# Patient Record
Sex: Female | Born: 1950 | Race: White | Hispanic: No | Marital: Married | Smoking: Never smoker
Health system: Southern US, Community
[De-identification: ages and names within clinical notes are randomized; demographics above are authoritative.]

## PROBLEM LIST (undated history)

## (undated) DIAGNOSIS — E785 Hyperlipidemia, unspecified: Secondary | ICD-10-CM

## (undated) DIAGNOSIS — E7439 Other disorders of intestinal carbohydrate absorption: Secondary | ICD-10-CM

## (undated) DIAGNOSIS — R296 Repeated falls: Secondary | ICD-10-CM

## (undated) DIAGNOSIS — E049 Nontoxic goiter, unspecified: Secondary | ICD-10-CM

## (undated) DIAGNOSIS — I878 Other specified disorders of veins: Secondary | ICD-10-CM

## (undated) DIAGNOSIS — R262 Difficulty in walking, not elsewhere classified: Secondary | ICD-10-CM

## (undated) DIAGNOSIS — F39 Unspecified mood [affective] disorder: Secondary | ICD-10-CM

## (undated) HISTORY — DX: Other specified disorders of veins: I87.8

## (undated) HISTORY — DX: Other disorders of intestinal carbohydrate absorption: E74.39

## (undated) HISTORY — DX: Hyperlipidemia, unspecified: E78.5

## (undated) HISTORY — PX: AUGMENTATION MAMMAPLASTY: SUR837

---

## 2000-04-28 ENCOUNTER — Other Ambulatory Visit: Admission: RE | Admit: 2000-04-28 | Discharge: 2000-04-28 | Payer: Self-pay | Admitting: Obstetrics and Gynecology

## 2000-07-01 ENCOUNTER — Ambulatory Visit (HOSPITAL_COMMUNITY): Admission: RE | Admit: 2000-07-01 | Discharge: 2000-07-01 | Payer: Self-pay | Admitting: Obstetrics and Gynecology

## 2000-08-24 ENCOUNTER — Encounter (INDEPENDENT_AMBULATORY_CARE_PROVIDER_SITE_OTHER): Payer: Self-pay

## 2000-08-24 ENCOUNTER — Ambulatory Visit (HOSPITAL_COMMUNITY): Admission: RE | Admit: 2000-08-24 | Discharge: 2000-08-24 | Payer: Self-pay | Admitting: Obstetrics and Gynecology

## 2000-10-25 ENCOUNTER — Encounter: Payer: Self-pay | Admitting: Obstetrics and Gynecology

## 2000-10-25 ENCOUNTER — Encounter: Admission: RE | Admit: 2000-10-25 | Discharge: 2000-10-25 | Payer: Self-pay | Admitting: Obstetrics and Gynecology

## 2001-09-11 ENCOUNTER — Emergency Department (HOSPITAL_COMMUNITY): Admission: EM | Admit: 2001-09-11 | Discharge: 2001-09-11 | Payer: Self-pay | Admitting: Emergency Medicine

## 2001-09-14 ENCOUNTER — Encounter (HOSPITAL_COMMUNITY): Admission: RE | Admit: 2001-09-14 | Discharge: 2001-12-13 | Payer: Self-pay | Admitting: Emergency Medicine

## 2001-10-31 ENCOUNTER — Encounter: Admission: RE | Admit: 2001-10-31 | Discharge: 2001-10-31 | Payer: Self-pay | Admitting: Obstetrics and Gynecology

## 2001-10-31 ENCOUNTER — Encounter: Payer: Self-pay | Admitting: Obstetrics and Gynecology

## 2002-11-05 ENCOUNTER — Encounter: Payer: Self-pay | Admitting: Obstetrics and Gynecology

## 2002-11-05 ENCOUNTER — Encounter: Admission: RE | Admit: 2002-11-05 | Discharge: 2002-11-05 | Payer: Self-pay | Admitting: Obstetrics and Gynecology

## 2003-11-22 ENCOUNTER — Encounter: Admission: RE | Admit: 2003-11-22 | Discharge: 2003-11-22 | Payer: Self-pay | Admitting: Obstetrics and Gynecology

## 2004-12-01 ENCOUNTER — Encounter: Admission: RE | Admit: 2004-12-01 | Discharge: 2004-12-01 | Payer: Self-pay | Admitting: Obstetrics and Gynecology

## 2005-07-06 ENCOUNTER — Encounter: Admission: RE | Admit: 2005-07-06 | Discharge: 2005-10-04 | Payer: Self-pay | Admitting: Orthotics

## 2005-12-03 ENCOUNTER — Encounter: Admission: RE | Admit: 2005-12-03 | Discharge: 2005-12-03 | Payer: Self-pay | Admitting: Obstetrics and Gynecology

## 2006-12-05 ENCOUNTER — Encounter: Admission: RE | Admit: 2006-12-05 | Discharge: 2006-12-05 | Payer: Self-pay | Admitting: Obstetrics and Gynecology

## 2007-12-11 ENCOUNTER — Encounter: Admission: RE | Admit: 2007-12-11 | Discharge: 2007-12-11 | Payer: Self-pay | Admitting: Obstetrics and Gynecology

## 2008-12-11 ENCOUNTER — Encounter: Admission: RE | Admit: 2008-12-11 | Discharge: 2008-12-11 | Payer: Self-pay | Admitting: Internal Medicine

## 2009-12-12 ENCOUNTER — Encounter: Admission: RE | Admit: 2009-12-12 | Discharge: 2009-12-12 | Payer: Self-pay | Admitting: Internal Medicine

## 2010-12-22 ENCOUNTER — Encounter
Admission: RE | Admit: 2010-12-22 | Discharge: 2010-12-22 | Payer: Self-pay | Source: Home / Self Care | Attending: Internal Medicine | Admitting: Internal Medicine

## 2011-05-14 NOTE — Op Note (Signed)
[facility]  Patient:    Barbara Eaton, Barbara Eaton                    MRN: [id number] Proc. Date: 08/24/00 Adm. Date:  [id number] Attending:  Maxie Better                           Operative Report  PREOPERATIVE DIAGNOSES:       1. Menometrorrhagia.                               2. Question endometrial polyp.  POSTOPERATIVE DIAGNOSES:      1. Menometrorrhagia                               2. Endometrial polyp.  OPERATION:                    1. Diagnostic hysteroscopy,                               2. Dilatation and curettage.                               3. Cervical polypectomy.                               4. Possible removal of endometrial polyps.  SURGEON:                      Sheronette A. Cherly Hensen, M.D.  ASSISTANT:  ANESTHESIA:                   General anesthesia.  ESTIMATED BLOOD LOSS:         Minimal.  INDICATIONS:                  This is a 60 year old female with menometrorrhagia who was found on sonohistogram to have a thickened posterior uterine wall suggestive of endometrial polyp who now presents for further management. The patient was also known to have a small endocervical polyp. The risks and benefits of the procedure had been explained to the patient. consent was signed and the patient was transferred to the operating room.  DESCRIPTION OF PROCEDURE:     Under adequate general anesthesia, the patient was placed in the dorsal lithotomy position.  Examination under anesthesia revealed an irregular retroflexed uterus.  No adnexal masses could be appreciated.  The patient was sterilely prepped and draped in the usual fashion.  The bladder was catheterized for a moderate amount of urine.  A bivalve speculum was placed in the vagina. Single-tooth tenaculum was placed on the anterior lip of the cervix.  Using polyps forceps, endocervical polyp was removed.  The cervix was then serially dilated up to #[facility] dilator.  A Sorbitol  primed diagnostic hysteroscope was introduced into the uterine cavity and a panoramic inspection revealed posterior wall that appeared thickened suggestive of possible polyps, anterior wall without any polypoid lesions and tubal ostia could be seen bilaterally.  Endocervical canal was inspected.  No other polyps noted.  The hysteroscope was removed.  The cavity was then curetted.  The hysteroscope was  then reinserted.  A small area still remained and that was recuretted as well.  The procedure then was terminated by removing all instruments from the vagina.  Specimen labeled endocervical polyp was sent to pathology as was the endometrial curetting with possible endometrial polyp.  The estimated blood loss was minimal.  There were no complications.  The patient tolerated the procedure well and was transferred to the recovery room in stable condition. DD:  08/24/00 TD:  08/25/00 Job: [id number]

## 2011-12-03 ENCOUNTER — Other Ambulatory Visit: Payer: Self-pay | Admitting: Internal Medicine

## 2011-12-03 DIAGNOSIS — Z1231 Encounter for screening mammogram for malignant neoplasm of breast: Secondary | ICD-10-CM

## 2011-12-07 ENCOUNTER — Other Ambulatory Visit (HOSPITAL_COMMUNITY): Payer: Self-pay | Admitting: Internal Medicine

## 2011-12-07 DIAGNOSIS — Z1231 Encounter for screening mammogram for malignant neoplasm of breast: Secondary | ICD-10-CM

## 2012-01-03 ENCOUNTER — Ambulatory Visit: Payer: Self-pay

## 2012-01-13 ENCOUNTER — Ambulatory Visit (HOSPITAL_COMMUNITY)
Admission: RE | Admit: 2012-01-13 | Discharge: 2012-01-13 | Disposition: A | Discharge status: Home / Self Care | Payer: BC Managed Care – HMO | Source: Ambulatory Visit | Attending: Internal Medicine | Admitting: Internal Medicine

## 2012-01-13 DIAGNOSIS — Z1231 Encounter for screening mammogram for malignant neoplasm of breast: Secondary | ICD-10-CM | POA: Insufficient documentation

## 2012-11-16 ENCOUNTER — Other Ambulatory Visit: Payer: Self-pay | Admitting: General Practice

## 2012-11-16 ENCOUNTER — Other Ambulatory Visit: Payer: Self-pay | Admitting: Plastic Surgery

## 2012-11-16 DIAGNOSIS — Z01818 Encounter for other preprocedural examination: Secondary | ICD-10-CM

## 2012-11-20 ENCOUNTER — Ambulatory Visit
Admission: RE | Admit: 2012-11-20 | Discharge: 2012-11-20 | Disposition: A | Discharge status: Home / Self Care | Payer: Self-pay | Source: Ambulatory Visit | Attending: Plastic Surgery | Admitting: Plastic Surgery

## 2012-11-20 DIAGNOSIS — Z01818 Encounter for other preprocedural examination: Secondary | ICD-10-CM

## 2013-11-14 ENCOUNTER — Other Ambulatory Visit (HOSPITAL_COMMUNITY): Payer: Self-pay | Admitting: Internal Medicine

## 2013-11-14 DIAGNOSIS — Z1231 Encounter for screening mammogram for malignant neoplasm of breast: Secondary | ICD-10-CM

## 2013-12-18 ENCOUNTER — Ambulatory Visit (HOSPITAL_COMMUNITY)
Admission: RE | Admit: 2013-12-18 | Discharge: 2013-12-18 | Disposition: A | Discharge status: Home / Self Care | Payer: BC Managed Care – PPO | Source: Ambulatory Visit | Attending: Internal Medicine | Admitting: Internal Medicine

## 2013-12-18 DIAGNOSIS — Z1231 Encounter for screening mammogram for malignant neoplasm of breast: Secondary | ICD-10-CM | POA: Insufficient documentation

## 2013-12-31 ENCOUNTER — Other Ambulatory Visit: Payer: Self-pay | Admitting: Internal Medicine

## 2013-12-31 DIAGNOSIS — R928 Other abnormal and inconclusive findings on diagnostic imaging of breast: Secondary | ICD-10-CM

## 2014-01-03 ENCOUNTER — Other Ambulatory Visit: Payer: MEDICARE

## 2014-01-03 ENCOUNTER — Ambulatory Visit
Admission: RE | Admit: 2014-01-03 | Discharge: 2014-01-03 | Disposition: A | Discharge status: Home / Self Care | Payer: Self-pay | Source: Ambulatory Visit | Attending: Internal Medicine | Admitting: Internal Medicine

## 2014-01-03 ENCOUNTER — Other Ambulatory Visit: Payer: Self-pay | Admitting: Internal Medicine

## 2014-01-03 DIAGNOSIS — R928 Other abnormal and inconclusive findings on diagnostic imaging of breast: Secondary | ICD-10-CM

## 2014-01-04 ENCOUNTER — Other Ambulatory Visit: Payer: MEDICARE

## 2014-01-08 ENCOUNTER — Other Ambulatory Visit: Payer: Self-pay | Admitting: Internal Medicine

## 2014-01-08 DIAGNOSIS — R109 Unspecified abdominal pain: Secondary | ICD-10-CM

## 2014-01-14 ENCOUNTER — Ambulatory Visit
Admission: RE | Admit: 2014-01-14 | Discharge: 2014-01-14 | Disposition: A | Discharge status: Home / Self Care | Payer: No Typology Code available for payment source | Source: Ambulatory Visit | Attending: Internal Medicine | Admitting: Internal Medicine

## 2014-01-14 ENCOUNTER — Ambulatory Visit
Admission: RE | Admit: 2014-01-14 | Discharge: 2014-01-14 | Disposition: A | Discharge status: Home / Self Care | Payer: Self-pay | Source: Ambulatory Visit | Attending: Internal Medicine | Admitting: Internal Medicine

## 2014-01-14 DIAGNOSIS — R109 Unspecified abdominal pain: Secondary | ICD-10-CM

## 2014-05-31 ENCOUNTER — Other Ambulatory Visit: Payer: Self-pay | Admitting: Plastic Surgery

## 2014-05-31 ENCOUNTER — Other Ambulatory Visit: Payer: Self-pay

## 2014-05-31 ENCOUNTER — Other Ambulatory Visit: Payer: Self-pay | Admitting: Internal Medicine

## 2014-05-31 DIAGNOSIS — N6320 Unspecified lump in the left breast, unspecified quadrant: Secondary | ICD-10-CM

## 2014-06-03 ENCOUNTER — Other Ambulatory Visit: Payer: Self-pay | Admitting: Internal Medicine

## 2014-06-03 DIAGNOSIS — N6320 Unspecified lump in the left breast, unspecified quadrant: Secondary | ICD-10-CM

## 2014-07-02 ENCOUNTER — Ambulatory Visit
Admission: RE | Admit: 2014-07-02 | Discharge: 2014-07-02 | Disposition: A | Discharge status: Home / Self Care | Payer: No Typology Code available for payment source | Source: Ambulatory Visit | Attending: Internal Medicine | Admitting: Internal Medicine

## 2014-07-02 ENCOUNTER — Encounter (INDEPENDENT_AMBULATORY_CARE_PROVIDER_SITE_OTHER): Payer: Self-pay

## 2014-07-02 DIAGNOSIS — N6320 Unspecified lump in the left breast, unspecified quadrant: Secondary | ICD-10-CM

## 2014-07-04 ENCOUNTER — Other Ambulatory Visit: Payer: Self-pay | Admitting: Internal Medicine

## 2014-07-04 DIAGNOSIS — Z1231 Encounter for screening mammogram for malignant neoplasm of breast: Secondary | ICD-10-CM

## 2014-11-19 IMAGING — US US TRANSVAGINAL NON-OB
1 series · 14 of 25 positions shown · non-contrast
Comparison: None.

CLINICAL DATA: Abdominal distention, constipation.

EXAM:
TRANSABDOMINAL AND TRANSVAGINAL ULTRASOUND OF PELVIS
TECHNIQUE: Both transabdominal and transvaginal ultrasound examinations of the
pelvis were performed. Transabdominal technique was performed for
global imaging of the pelvis including uterus, ovaries, adnexal
regions, and pelvic cul-de-sac. It was necessary to proceed with
endovaginal exam following the transabdominal exam to visualize the
uterus, endometrium, ovaries and adnexal regions.

[Series 1: us transvaginal non-ob · 0.32mm/px · 14 of 32 slices shown]
[im 1/32]
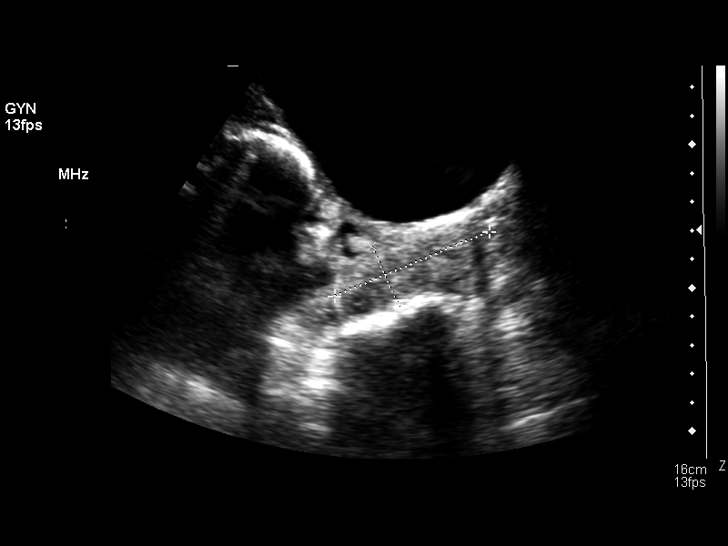
[im 3/32]
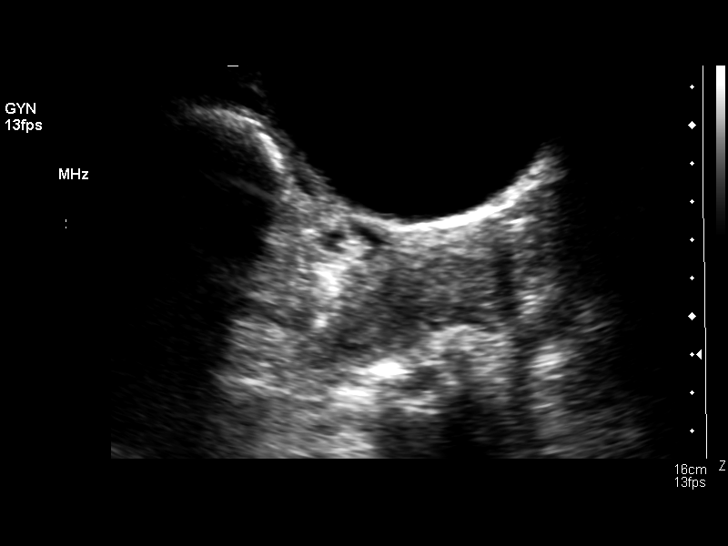
[im 6/32]
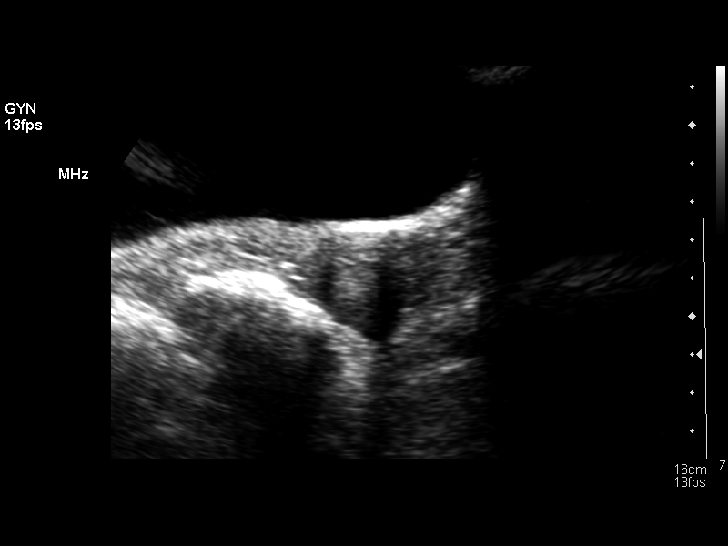
[im 8/32]
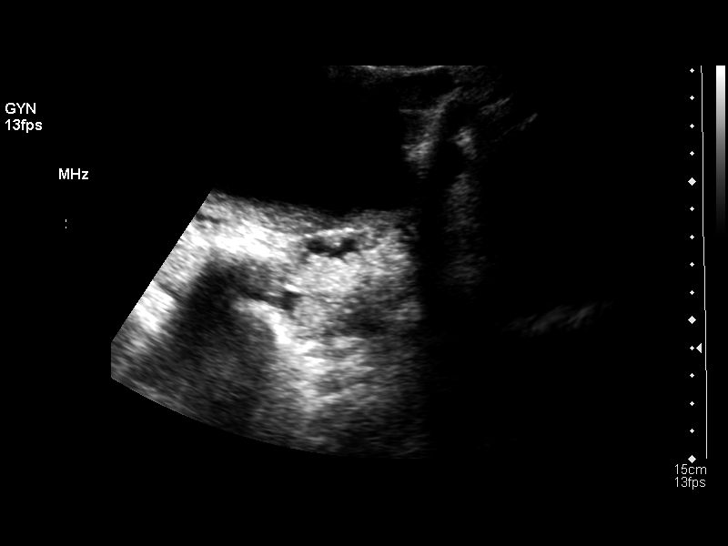
[im 11/32]
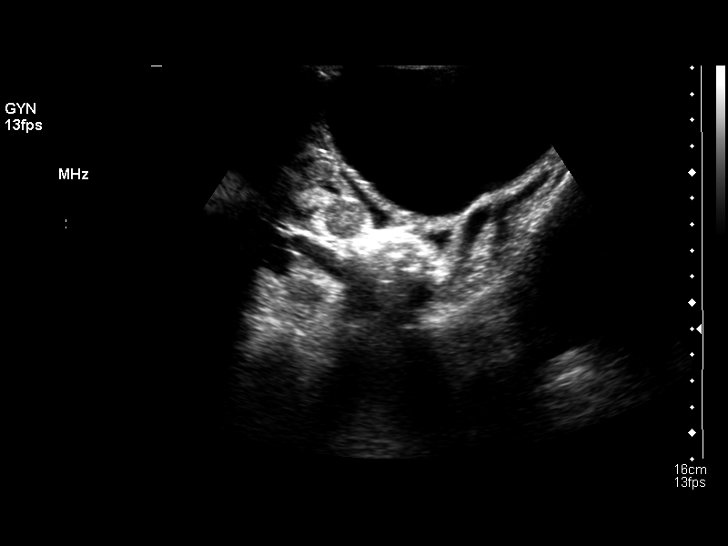
[im 12/32]
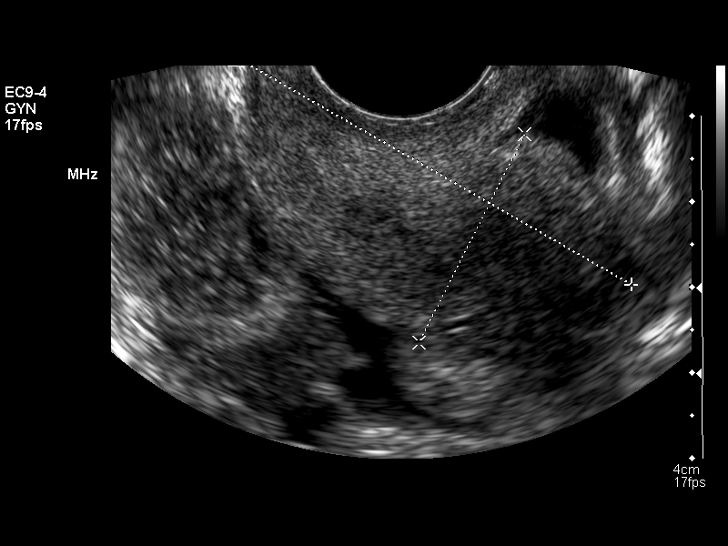
[im 15/32]
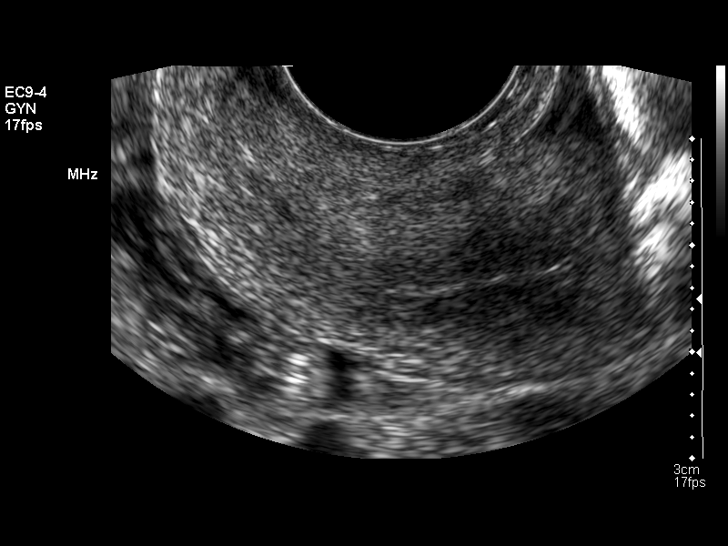
[im 17/32]
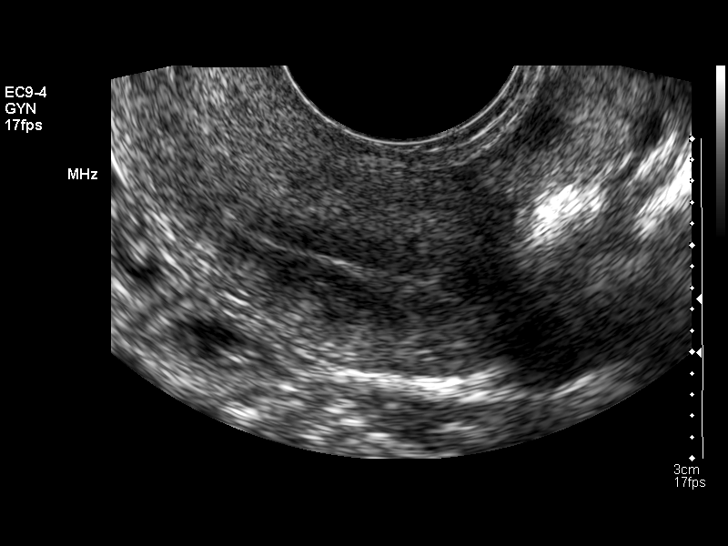
[im 20/32]
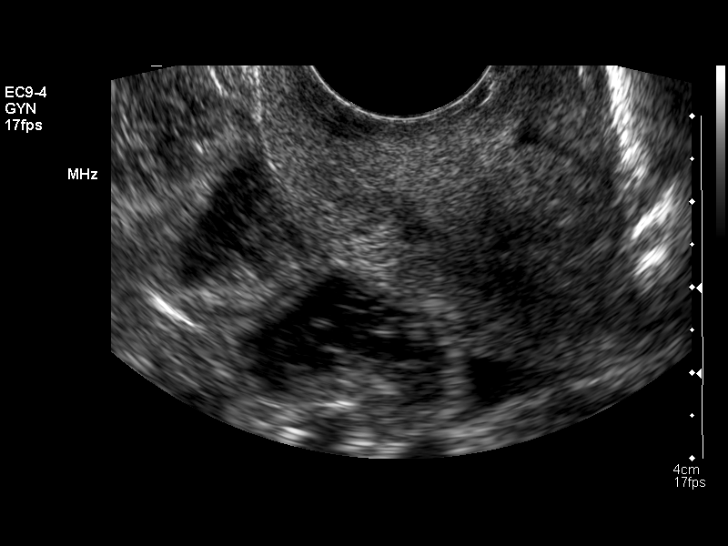
[im 21/32]
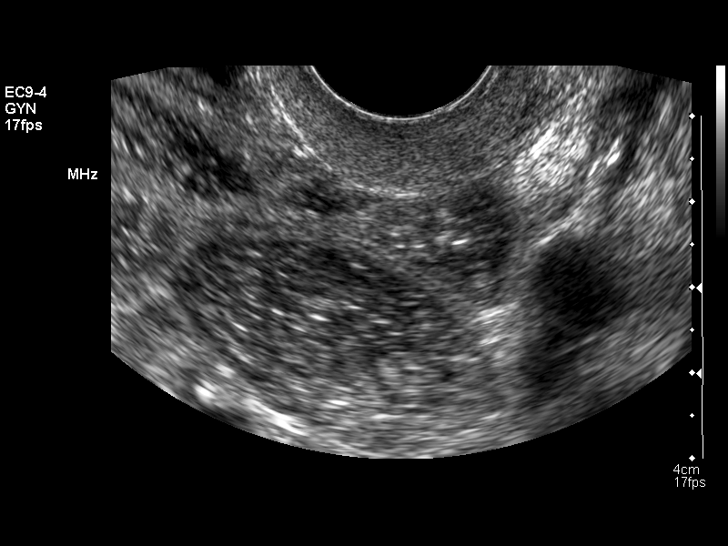
[im 24/32]
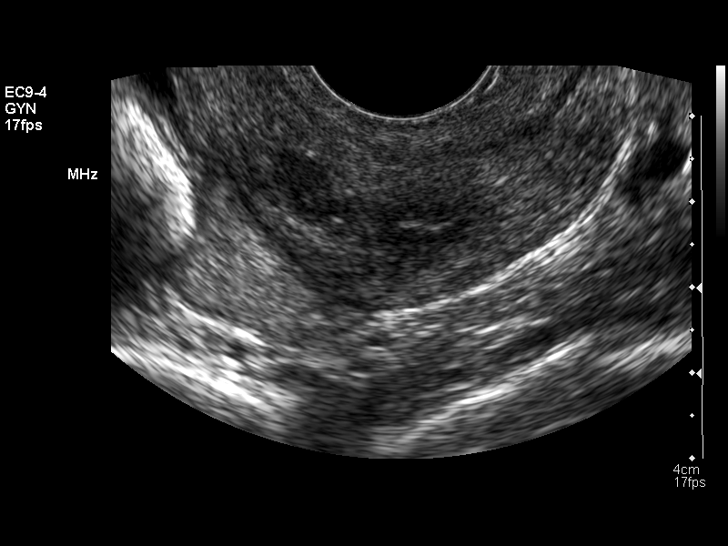
[im 26/32]
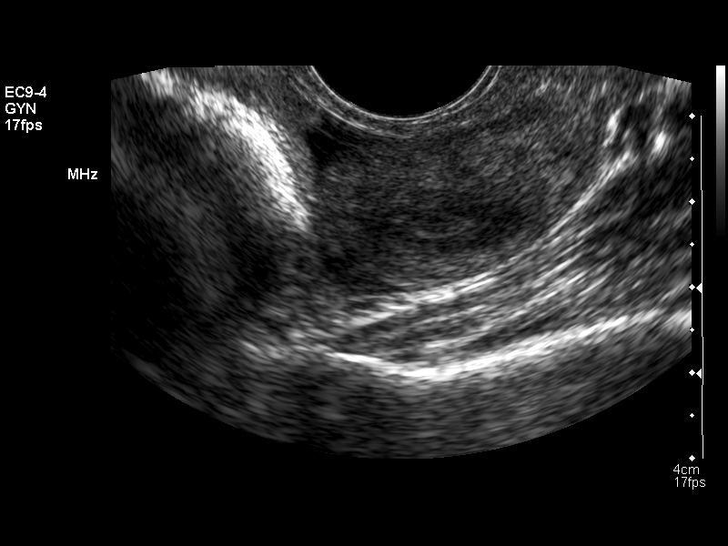
[im 29/32]
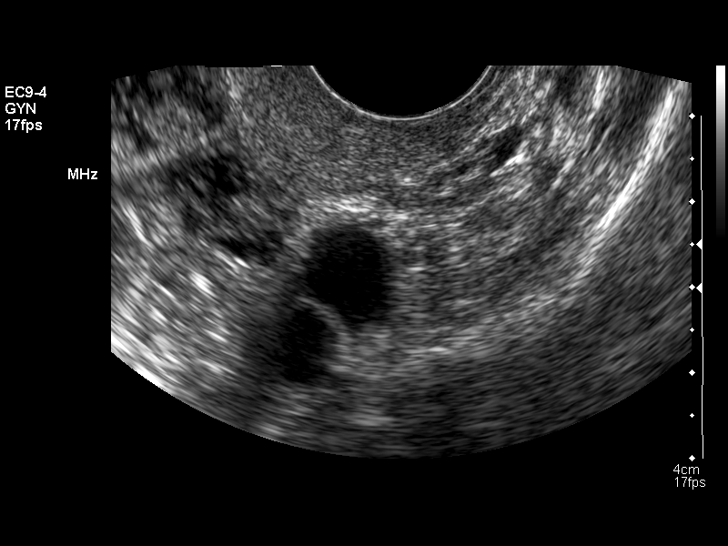
[im 32/32]
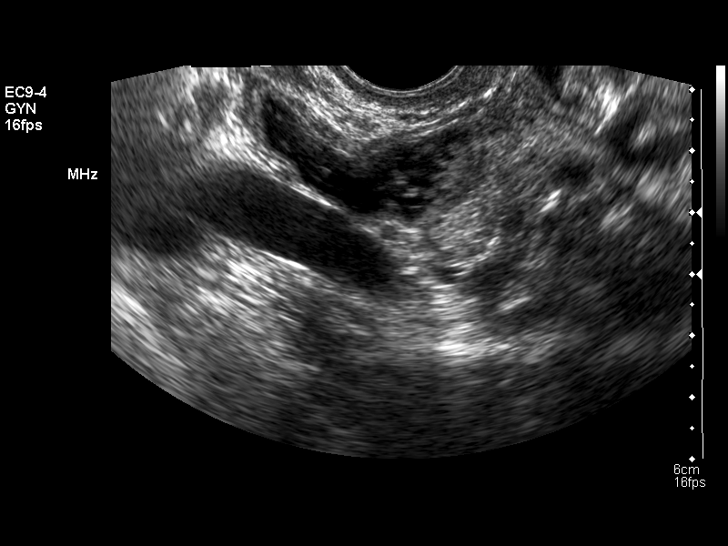

[14 of 25 positions shown; findings below may reference images not displayed]

FINDINGS: Uterus

Measurements: 5.2 x 2.7 x 3.5 cm. An intramural hypoechoic lesion in
the right aspect of the uterine body measures 1.2 x 0.7 x 1.2 cm.
Parenchymal echogenicity is otherwise uniform.

Endometrium

Thickness: 2 mm, within normal limits. No focal abnormality
visualized.

Right ovary

Not visualized.

Left ovary

Not visualized.

Other findings

No free fluid.
IMPRESSION: 1. Small uterine fibroid.
2. Ovaries are not visualized.

## 2014-12-05 ENCOUNTER — Other Ambulatory Visit (HOSPITAL_COMMUNITY): Payer: Self-pay | Admitting: Internal Medicine

## 2014-12-05 DIAGNOSIS — Z1231 Encounter for screening mammogram for malignant neoplasm of breast: Secondary | ICD-10-CM

## 2014-12-31 ENCOUNTER — Ambulatory Visit (HOSPITAL_COMMUNITY)
Admission: RE | Admit: 2014-12-31 | Discharge: 2014-12-31 | Disposition: A | Discharge status: Home / Self Care | Payer: BLUE CROSS/BLUE SHIELD | Source: Ambulatory Visit | Attending: Internal Medicine | Admitting: Internal Medicine

## 2014-12-31 ENCOUNTER — Other Ambulatory Visit (HOSPITAL_COMMUNITY): Payer: Self-pay | Admitting: Internal Medicine

## 2014-12-31 DIAGNOSIS — Z1231 Encounter for screening mammogram for malignant neoplasm of breast: Secondary | ICD-10-CM

## 2019-08-30 ENCOUNTER — Other Ambulatory Visit: Payer: Self-pay | Admitting: *Deleted

## 2019-08-30 ENCOUNTER — Other Ambulatory Visit: Payer: Self-pay | Admitting: Physician Assistant

## 2019-08-30 ENCOUNTER — Other Ambulatory Visit: Payer: Self-pay | Admitting: Obstetrics & Gynecology

## 2019-08-30 DIAGNOSIS — N6001 Solitary cyst of right breast: Secondary | ICD-10-CM

## 2019-09-14 ENCOUNTER — Other Ambulatory Visit: Payer: Self-pay | Admitting: Certified Nurse Midwife

## 2019-09-18 ENCOUNTER — Other Ambulatory Visit: Payer: Self-pay

## 2019-09-18 ENCOUNTER — Other Ambulatory Visit: Payer: Self-pay | Admitting: Physician Assistant

## 2019-09-18 ENCOUNTER — Ambulatory Visit
Admission: RE | Admit: 2019-09-18 | Discharge: 2019-09-18 | Disposition: A | Discharge status: Home / Self Care | Payer: Medicare Other | Source: Ambulatory Visit | Attending: Physician Assistant | Admitting: Physician Assistant

## 2019-09-18 DIAGNOSIS — N6001 Solitary cyst of right breast: Secondary | ICD-10-CM

## 2020-03-11 ENCOUNTER — Ambulatory Visit
Admission: RE | Admit: 2020-03-11 | Discharge: 2020-03-11 | Disposition: A | Discharge status: Home / Self Care | Payer: Medicare Other | Source: Ambulatory Visit | Attending: Physician Assistant | Admitting: Physician Assistant

## 2020-03-11 ENCOUNTER — Other Ambulatory Visit: Payer: Self-pay

## 2020-03-11 DIAGNOSIS — N6001 Solitary cyst of right breast: Secondary | ICD-10-CM

## 2020-03-13 ENCOUNTER — Other Ambulatory Visit: Payer: Self-pay | Admitting: Obstetrics & Gynecology

## 2020-03-13 DIAGNOSIS — N6310 Unspecified lump in the right breast, unspecified quadrant: Secondary | ICD-10-CM

## 2021-03-12 ENCOUNTER — Ambulatory Visit
Admission: RE | Admit: 2021-03-12 | Discharge: 2021-03-12 | Disposition: A | Discharge status: Home / Self Care | Payer: Medicare Other | Source: Ambulatory Visit | Attending: Obstetrics & Gynecology | Admitting: Obstetrics & Gynecology

## 2021-03-12 ENCOUNTER — Other Ambulatory Visit: Payer: Self-pay

## 2021-03-12 DIAGNOSIS — N6310 Unspecified lump in the right breast, unspecified quadrant: Secondary | ICD-10-CM

## 2022-02-08 ENCOUNTER — Other Ambulatory Visit: Payer: Self-pay | Admitting: Obstetrics & Gynecology

## 2022-02-08 DIAGNOSIS — Z1231 Encounter for screening mammogram for malignant neoplasm of breast: Secondary | ICD-10-CM

## 2022-03-15 ENCOUNTER — Other Ambulatory Visit: Payer: Self-pay

## 2022-03-15 ENCOUNTER — Ambulatory Visit
Admission: RE | Admit: 2022-03-15 | Discharge: 2022-03-15 | Disposition: A | Discharge status: Home / Self Care | Payer: Medicare Other | Source: Ambulatory Visit | Attending: Obstetrics & Gynecology | Admitting: Obstetrics & Gynecology

## 2022-03-15 DIAGNOSIS — Z1231 Encounter for screening mammogram for malignant neoplasm of breast: Secondary | ICD-10-CM

## 2022-10-22 ENCOUNTER — Other Ambulatory Visit: Payer: Self-pay | Admitting: Family Medicine

## 2022-10-22 DIAGNOSIS — Z1231 Encounter for screening mammogram for malignant neoplasm of breast: Secondary | ICD-10-CM

## 2023-01-18 IMAGING — MG DIGITAL SCREENING BREAST BILAT IMPLANT W/ TOMO W/ CAD
9 of 14 series · 9 of 34 positions shown · non-contrast
Comparison: Previous exam(s).

CLINICAL DATA: Screening.

EXAM:
DIGITAL SCREENING BILATERAL MAMMOGRAM WITH IMPLANTS, CAD AND
TOMOSYNTHESIS
TECHNIQUE: Bilateral screening digital craniocaudal and mediolateral oblique
mammograms were obtained. Bilateral screening digital breast
tomosynthesis was performed. The images were evaluated with
computer-aided detection. Standard and/or implant displaced views
were performed.

[R CC]
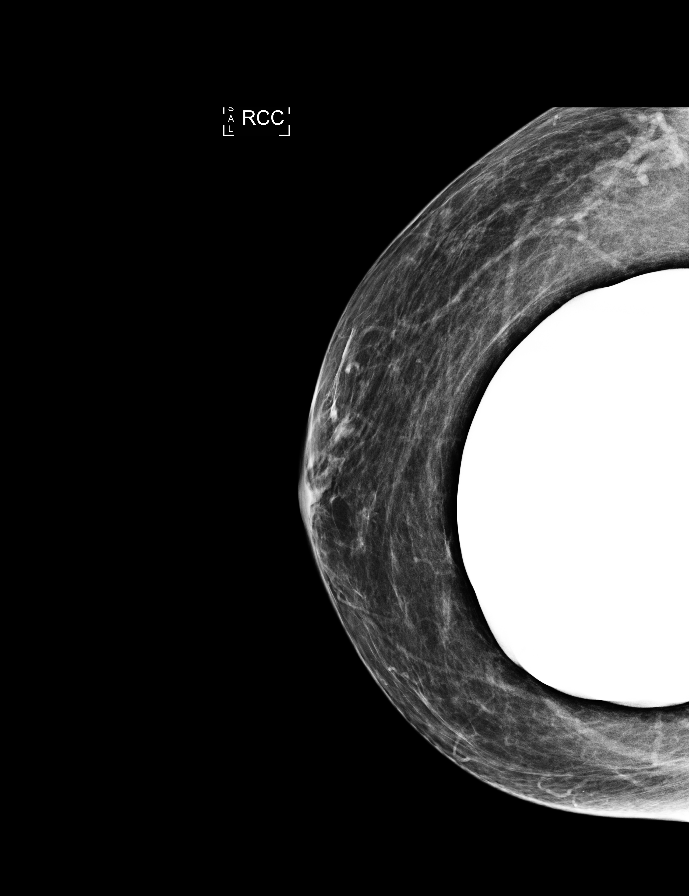

[R MLO]
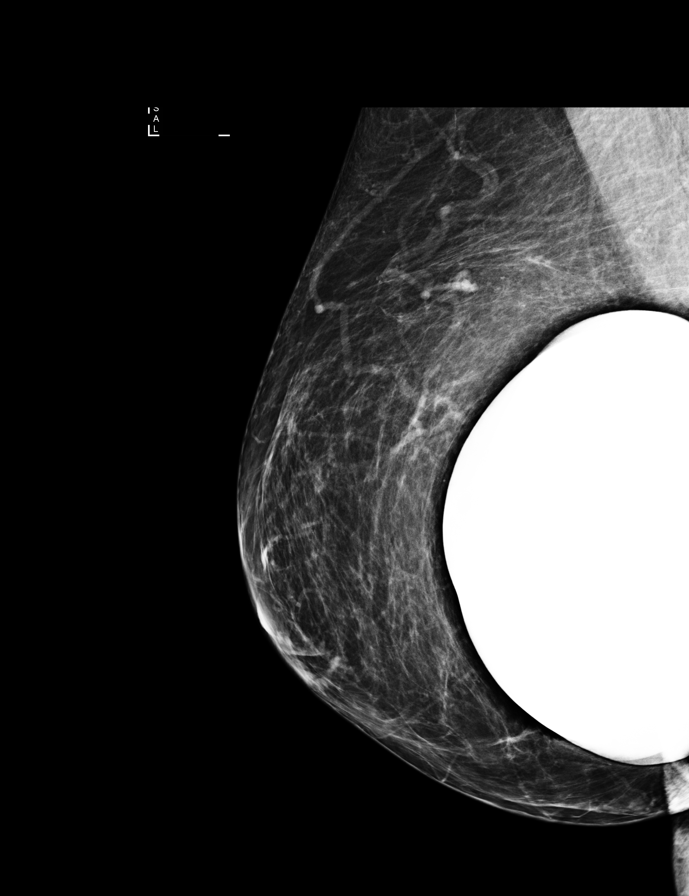

[L CC]
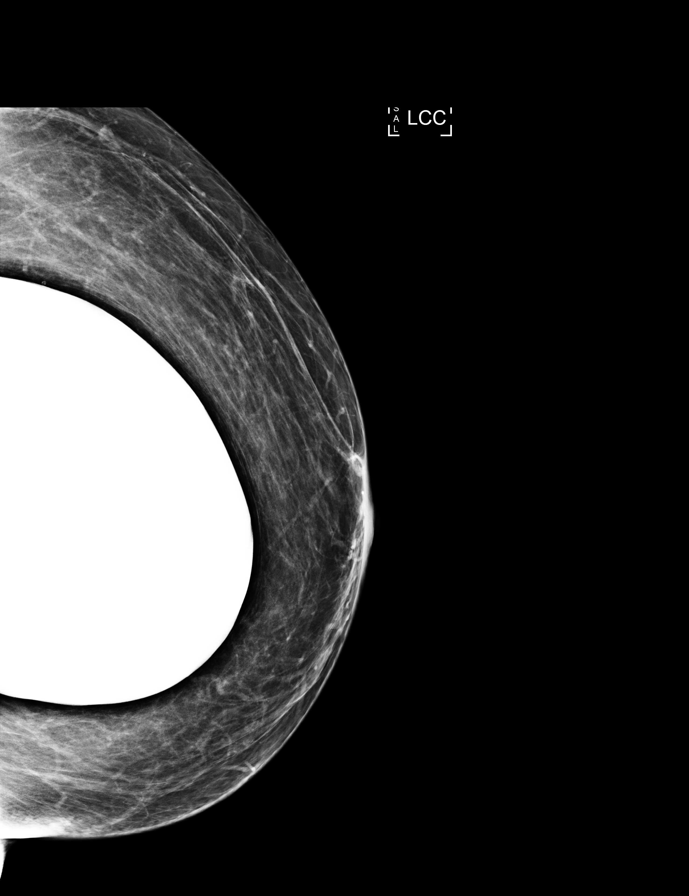

[L MLO]
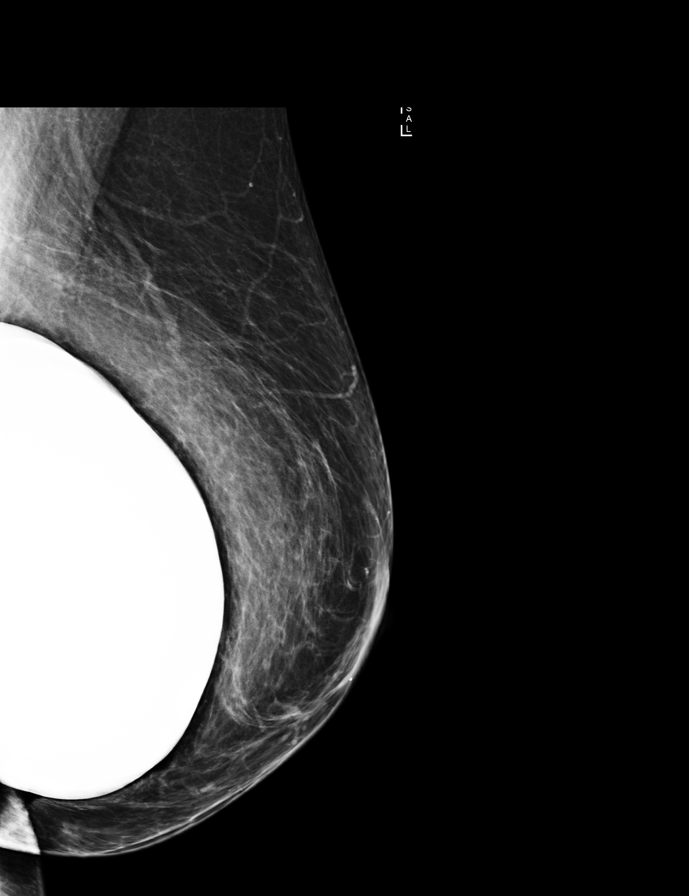

[R MLO synth-2D]
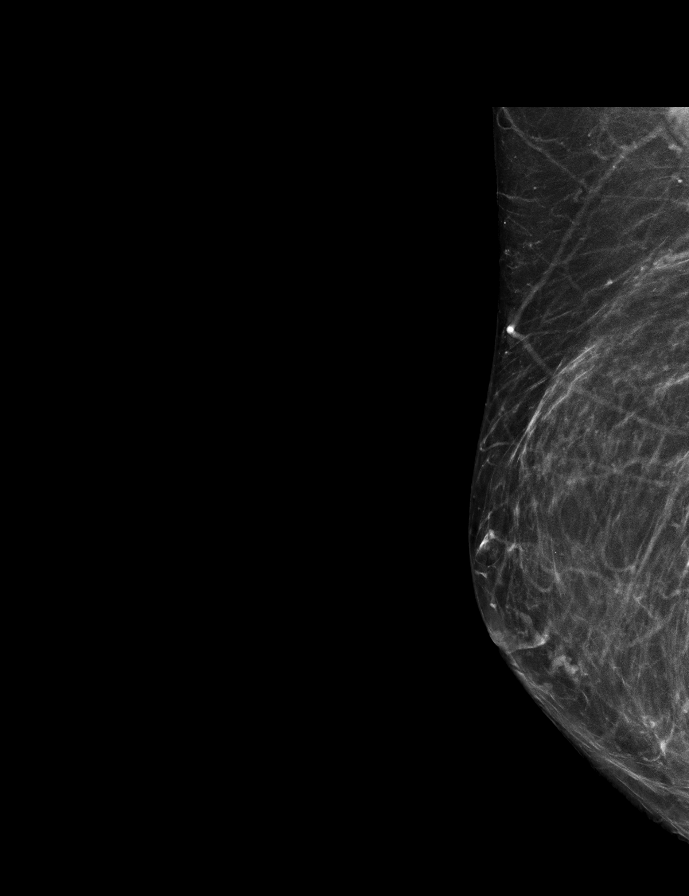

[R CC synth-2D]
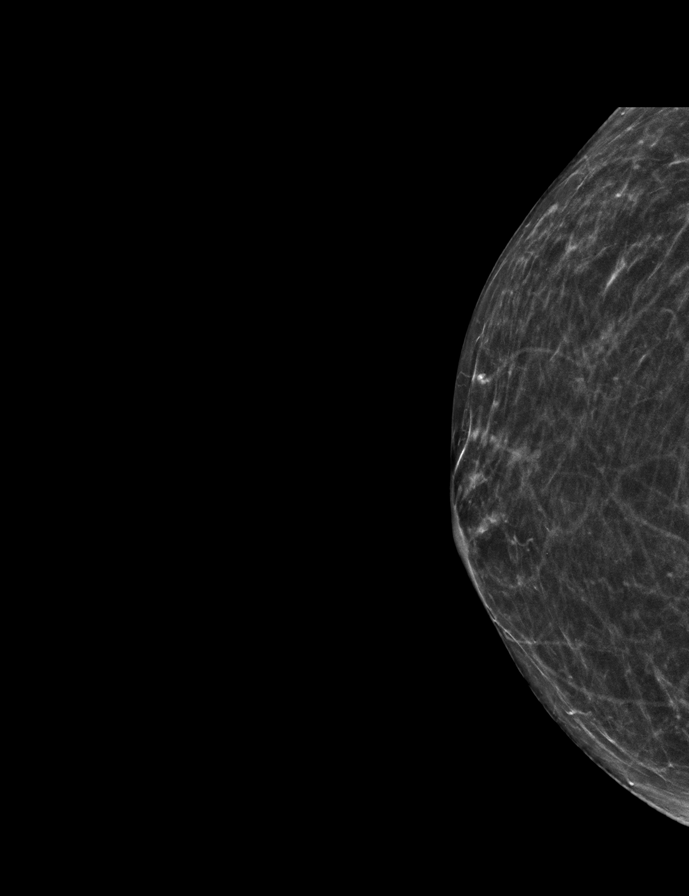

[L MLO synth-2D]
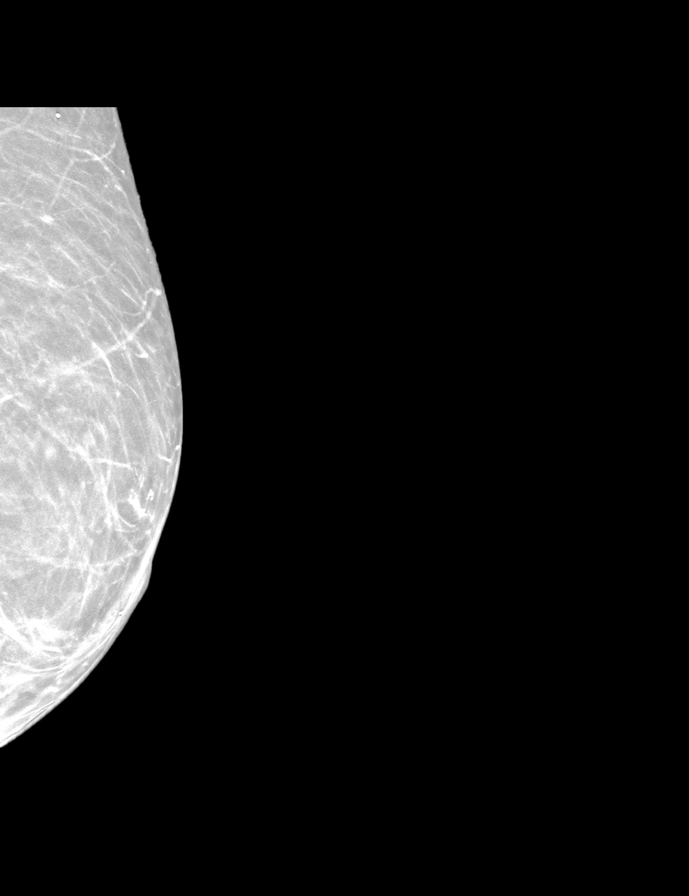

[L CC synth-2D (1 of 2)]
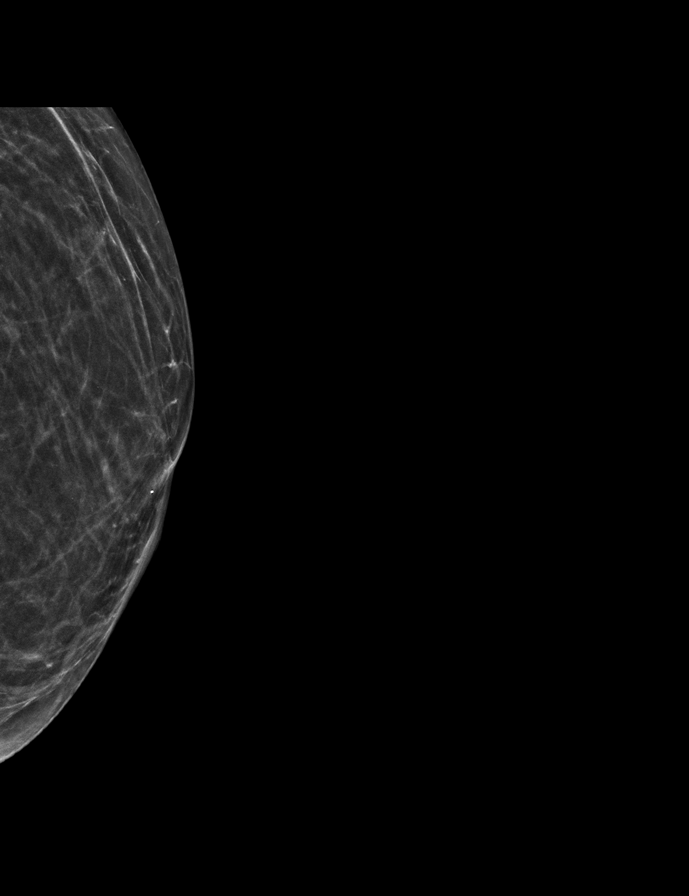

[L CC synth-2D (2 of 2)]
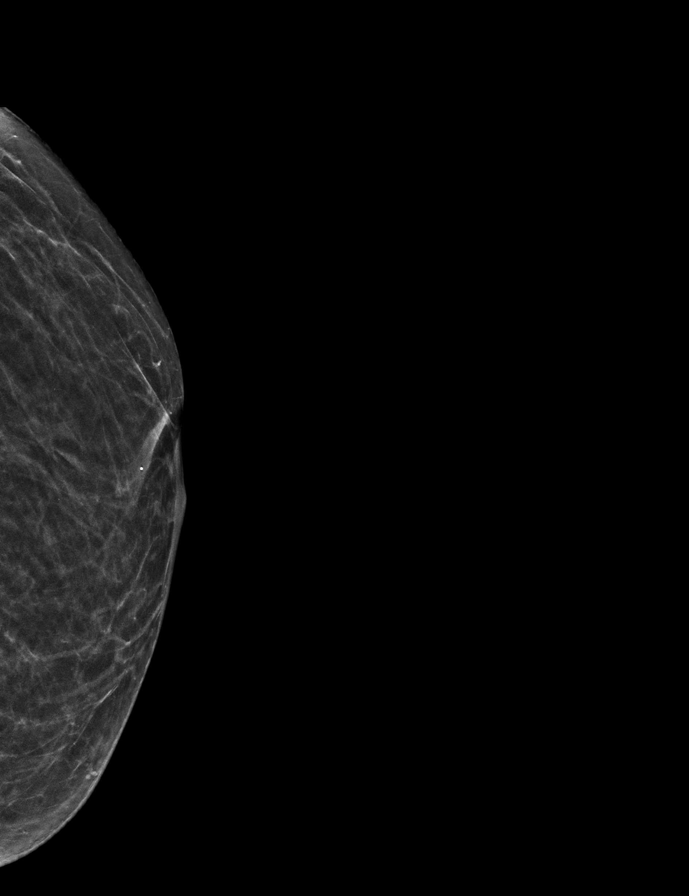

[9 of 34 positions shown; findings below may reference images not displayed]

ACR Breast Density Category b: There are scattered areas of
fibroglandular density.
FINDINGS: The patient has prepectoral implants. There are no findings
suspicious for malignancy.
IMPRESSION: No mammographic evidence of malignancy. A result letter of this
screening mammogram will be mailed directly to the patient.

RECOMMENDATION:
Screening mammogram in one year. (Code:TC-L-OXK)

BI-RADS CATEGORY  1:  Negative.

## 2023-03-18 ENCOUNTER — Ambulatory Visit
Admission: RE | Admit: 2023-03-18 | Discharge: 2023-03-18 | Disposition: A | Discharge status: Home / Self Care | Payer: Medicare Other | Source: Ambulatory Visit | Attending: Family Medicine

## 2023-03-18 DIAGNOSIS — Z1231 Encounter for screening mammogram for malignant neoplasm of breast: Secondary | ICD-10-CM

## 2023-09-10 ENCOUNTER — Other Ambulatory Visit: Payer: Self-pay

## 2023-09-10 ENCOUNTER — Emergency Department (HOSPITAL_COMMUNITY): Payer: Medicare Other

## 2023-09-10 ENCOUNTER — Emergency Department (HOSPITAL_COMMUNITY)
Admission: EM | Admit: 2023-09-10 | Discharge: 2023-09-10 | Disposition: A | Discharge status: Home / Self Care | Payer: Medicare Other | Attending: Emergency Medicine | Admitting: Emergency Medicine

## 2023-09-10 DIAGNOSIS — M25571 Pain in right ankle and joints of right foot: Secondary | ICD-10-CM | POA: Diagnosis not present

## 2023-09-10 DIAGNOSIS — R0602 Shortness of breath: Secondary | ICD-10-CM | POA: Diagnosis not present

## 2023-09-10 DIAGNOSIS — M25572 Pain in left ankle and joints of left foot: Secondary | ICD-10-CM | POA: Insufficient documentation

## 2023-09-10 DIAGNOSIS — R011 Cardiac murmur, unspecified: Secondary | ICD-10-CM | POA: Insufficient documentation

## 2023-09-10 DIAGNOSIS — M25551 Pain in right hip: Secondary | ICD-10-CM | POA: Insufficient documentation

## 2023-09-10 DIAGNOSIS — E876 Hypokalemia: Secondary | ICD-10-CM | POA: Diagnosis not present

## 2023-09-10 DIAGNOSIS — M7989 Other specified soft tissue disorders: Secondary | ICD-10-CM | POA: Diagnosis present

## 2023-09-10 DIAGNOSIS — R600 Localized edema: Secondary | ICD-10-CM | POA: Diagnosis not present

## 2023-09-10 DIAGNOSIS — G8929 Other chronic pain: Secondary | ICD-10-CM | POA: Insufficient documentation

## 2023-09-10 LAB — CBC WITH DIFFERENTIAL/PLATELET
Abs Immature Granulocytes: 0.01 10*3/uL (ref 0.00–0.07)
Basophils Absolute: 0.1 10*3/uL (ref 0.0–0.1)
Basophils Relative: 2 %
Eosinophils Absolute: 0.1 10*3/uL (ref 0.0–0.5)
Eosinophils Relative: 1 %
HCT: 38.3 % (ref 36.0–46.0)
Hemoglobin: 13.5 g/dL (ref 12.0–15.0)
Immature Granulocytes: 0 %
Lymphocytes Relative: 28 %
Lymphs Abs: 1.3 10*3/uL (ref 0.7–4.0)
MCH: 31.1 pg (ref 26.0–34.0)
MCHC: 35.2 g/dL (ref 30.0–36.0)
MCV: 88.2 fL (ref 80.0–100.0)
Monocytes Absolute: 0.5 10*3/uL (ref 0.1–1.0)
Monocytes Relative: 11 %
Neutro Abs: 2.7 10*3/uL (ref 1.7–7.7)
Neutrophils Relative %: 58 %
Platelets: 277 10*3/uL (ref 150–400)
RBC: 4.34 MIL/uL (ref 3.87–5.11)
RDW: 13.1 % (ref 11.5–15.5)
WBC: 4.6 10*3/uL (ref 4.0–10.5)
nRBC: 0 % (ref 0.0–0.2)

## 2023-09-10 LAB — BASIC METABOLIC PANEL
Anion gap: 8 (ref 5–15)
BUN: 12 mg/dL (ref 8–23)
CO2: 25 mmol/L (ref 22–32)
Calcium: 8.7 mg/dL — ABNORMAL LOW (ref 8.9–10.3)
Chloride: 100 mmol/L (ref 98–111)
Creatinine, Ser: 0.58 mg/dL (ref 0.44–1.00)
GFR, Estimated: >60 mL/min (ref >60)
Glucose, Bld: 112 mg/dL — ABNORMAL HIGH (ref 70–99)
Potassium: 3 mmol/L — ABNORMAL LOW (ref 3.5–5.1)
Sodium: 133 mmol/L — ABNORMAL LOW (ref 135–145)

## 2023-09-10 LAB — BRAIN NATRIURETIC PEPTIDE: B Natriuretic Peptide: 24 pg/mL (ref 0.0–100.0)

## 2023-09-10 LAB — TROPONIN I (HIGH SENSITIVITY)
Troponin I (High Sensitivity): 8 ng/L (ref <18)
Troponin I (High Sensitivity): 9 ng/L (ref <18)

## 2023-09-10 MED ORDER — FUROSEMIDE 10 MG/ML IJ SOLN
20.0000 mg | Freq: Once | INTRAMUSCULAR | Status: AC
  Administered 2023-09-10: 20 mg via INTRAVENOUS
  Filled 2023-09-10: qty 2

## 2023-09-10 MED ORDER — TRAMADOL HCL 50 MG PO TABS
50.0000 mg | ORAL_TABLET | Freq: Four times a day (QID) | ORAL | 0 refills | Status: DC | PRN
Start: 2023-09-10 — End: 2023-10-10

## 2023-09-10 MED ORDER — POTASSIUM CHLORIDE CRYS ER 20 MEQ PO TBCR
40.0000 meq | EXTENDED_RELEASE_TABLET | Freq: Once | ORAL | Status: AC
  Administered 2023-09-10: 40 meq via ORAL
  Filled 2023-09-10: qty 2

## 2023-09-10 MED ORDER — FENTANYL CITRATE PF 50 MCG/ML IJ SOSY
50.0000 ug | PREFILLED_SYRINGE | Freq: Once | INTRAMUSCULAR | Status: AC
  Administered 2023-09-10: 50 ug via INTRAVENOUS
  Filled 2023-09-10: qty 1

## 2023-09-10 NOTE — Discharge Instructions (Signed)
You were seen in the emergency department for increased swelling and pain in your feet and ankles due to swelling.  Your lab work showed your potassium was mildly low.  You were given some potassium and an IV dose of diuretic.  Please keep to a low-salt diet and elevate your legs when you can.  Use the compression stockings when you are able to get them.  Follow-up with your primary care doctor for further evaluation.

## 2023-09-10 NOTE — ED Provider Notes (Signed)
[name] Provider Note   CSN: [id number] Arrival date & time: 09/10/23  1149     History  Chief Complaint  Patient presents with   Ankle Pain    Barbara Eaton is a 72 y.o. female.  She is brought in by ambulance from home with complaint of pain and swelling in both of her ankles and feet.  She said this has been going on a month.  She has chronic pain in her right hip and supposed to be getting surgery next month.  She says that causes her chronic pain.  She has been talking to her doctor about her new foot swelling for the last month and finally saw them 2 days ago.  He took her off of amlodipine and put her on Mobic and losartan.  She said that it turned her bowels black so she stopped taking them.  He also prescribed a compression stocking but it was the wrong size and they are ordering her a new one.  Is also complaining of feeling short of breath today.  No cough no fever.  No recent falls.  She said she cannot take narcotics for pain because her orthopedic doctor told her it would affect her healing.  The history is provided by the patient.  Ankle Pain Location:  Ankle and foot Time since incident:  1 month Injury: no   Ankle location:  L ankle and R ankle Foot location:  L foot and R foot Pain details:    Quality:  Aching   Onset quality:  Gradual   Duration:  1 month   Timing:  Constant   Progression:  Worsening Chronicity:  New Relieved by:  Nothing Worsened by:  Bearing weight Ineffective treatments:  Rest Associated symptoms: swelling   Associated symptoms: no fever        Home Medications Prior to Admission medications   Not on File      Allergies    Patient has no allergy information on record.    Review of Systems   Review of Systems  Constitutional:  Negative for fever.  HENT:  Negative for sore throat.   Respiratory:  Positive for shortness of breath.   Cardiovascular:  Positive for leg swelling.  Negative for chest pain.  Gastrointestinal:  Negative for abdominal pain.  Genitourinary:  Negative for dysuria.  Skin:  Negative for rash.    Physical Exam Updated Vital Signs BP (!) 150/73 (BP Location: Left Arm)   Pulse 69   Temp 97.9 F (36.6 C) (Oral)   Resp 17   Ht 5\' 1"  (1.549 m)   Wt 68 kg   SpO2 99%   BMI 28.34 kg/m  Physical Exam Vitals and nursing note reviewed.  Constitutional:      General: She is not in acute distress.    Appearance: Normal appearance. She is well-developed.  HENT:     Head: Normocephalic and atraumatic.  Eyes:     Conjunctiva/sclera: Conjunctivae normal.  Cardiovascular:     Rate and Rhythm: Normal rate and regular rhythm.     Heart sounds: Murmur (SEM) heard.  Pulmonary:     Effort: Pulmonary effort is normal. No respiratory distress.     Breath sounds: Normal breath sounds.  Abdominal:     Palpations: Abdomen is soft.     Tenderness: There is no abdominal tenderness. There is no guarding or rebound.  Musculoskeletal:     Cervical back: Neck supple.  Right lower leg: Edema present.     Left lower leg: Edema present.  Skin:    General: Skin is warm and dry.     Capillary Refill: Capillary refill takes less than 2 seconds.  Neurological:     General: No focal deficit present.     Mental Status: She is alert.     ED Results / Procedures / Treatments   Labs (all labs ordered are listed, but only abnormal results are displayed) Labs Reviewed  BASIC METABOLIC PANEL - Abnormal; Notable for the following components:      Result Value   Sodium 133 (*)    Potassium 3.0 (*)    Glucose, Bld 112 (*)    Calcium 8.7 (*)    All other components within normal limits  BRAIN NATRIURETIC PEPTIDE  CBC WITH DIFFERENTIAL/PLATELET  TROPONIN I (HIGH SENSITIVITY)  TROPONIN I (HIGH SENSITIVITY)    EKG None  Radiology DG Chest Port 1 View  Result Date: 09/10/2023 CLINICAL DATA:  Shortness of breath EXAM: PORTABLE CHEST 1 VIEW COMPARISON:   None Available. FINDINGS: No consolidation, pneumothorax or effusion. No edema. Tortuous ectatic aorta. Normal cardiopericardial silhouette. Overlapping cardiac leads. IMPRESSION: No acute cardiopulmonary disease. Electronically Signed   By: Karen Kays M.D.   On: 09/10/2023 13:10    Procedures Procedures    Medications Ordered in ED Medications  fentaNYL (SUBLIMAZE) injection 50 mcg (50 mcg Intravenous Given 09/10/23 1247)  potassium chloride SA (KLOR-CON M) CR tablet 40 mEq (40 mEq Oral Given 09/10/23 1348)  furosemide (LASIX) injection 20 mg (20 mg Intravenous Given 09/10/23 1348)    ED Course/ Medical Decision Making/ A&P Clinical Course as of 09/10/23 1738  Sat Sep 10, 2023  1439 Patient has diuresed well here and is in no distress.  She is agreeable to trial of some tramadol for pain.  She is also going to try to use the compression stockings when she gets them.  Recommended close follow-up with her PCP. [MB]    Clinical Course User Index [MB] Terrilee Files, MD                                 Medical Decision Making Amount and/or Complexity of Data Reviewed Labs: ordered. Radiology: ordered.  Risk Prescription drug management.   This patient complains of pain and swelling of her feet and ankles, chronic right hip pain, shortness of breath; this involves an extensive number of treatment Options and is a complaint that carries with it a high risk of complications and morbidity. The differential includes CHF, peripheral edema, metabolic derangement, renal failure  I ordered, reviewed and interpreted labs, which included CBC normal chemistries with low potassium normal renal function BNP normal troponin normal I ordered medication IV pain medicine, oral potassium and IV diuretic and reviewed PMP when indicated. I ordered imaging studies which included chest x-ray and I independently    visualized and interpreted imaging which showed no acute findings Additional history  obtained from EMS Previous records obtained and reviewed in epic including recent PCP notes Cardiac monitoring reviewed, sinus rhythm Social determinants considered, no significant barriers Critical Interventions: None  After the interventions stated above, I reevaluated the patient and found patient to be symptomatically improved in no distress Admission and further testing considered, recommended close follow-up with her PCP.  She is going to try to get the compression stockings that fit.  Return instructions discussed  Final Clinical Impression(s) / ED Diagnoses Final diagnoses:  Peripheral edema  SOB (shortness of breath)  Hypokalemia    Rx / DC Orders ED Discharge Orders          Ordered    traMADol (ULTRAM) 50 MG tablet  Every 6 hours PRN        09/10/23 1441              Terrilee Files, MD 09/10/23 [phone]

## 2023-09-10 NOTE — ED Triage Notes (Signed)
Brought in by [facility], Seen at [facility] prescribed Meloxicam and losartan for swelling to ankle. Told to do compression socks and discharged home. Patient did not do compressions socks due to MD prescribed wrong ones. [facility] stated "Patient complained of new onset of SOB. Vitals stable."

## 2023-09-10 NOTE — ED Notes (Addendum)
Pt repositioned in bed, pillows placed under leg and hip per pts request. Brief placed on pt, pt having difficulty turning on R side when attempting to get pt to turn side to side in bed

## 2023-09-22 ENCOUNTER — Ambulatory Visit: Payer: 59 | Admitting: Family Medicine

## 2023-10-10 ENCOUNTER — Encounter (HOSPITAL_COMMUNITY): Payer: Self-pay | Admitting: Emergency Medicine

## 2023-10-10 ENCOUNTER — Inpatient Hospital Stay (HOSPITAL_COMMUNITY)
Admission: EM | Admit: 2023-10-10 | Discharge: 2023-10-14 | DRG: 872 | Disposition: A | Discharge status: Skilled Nursing Facility | Payer: Medicare Other | Attending: Internal Medicine | Admitting: Internal Medicine

## 2023-10-10 ENCOUNTER — Other Ambulatory Visit: Payer: Self-pay

## 2023-10-10 ENCOUNTER — Emergency Department (HOSPITAL_COMMUNITY): Payer: Medicare Other

## 2023-10-10 DIAGNOSIS — L89896 Pressure-induced deep tissue damage of other site: Secondary | ICD-10-CM | POA: Diagnosis present

## 2023-10-10 DIAGNOSIS — R7989 Other specified abnormal findings of blood chemistry: Secondary | ICD-10-CM | POA: Diagnosis not present

## 2023-10-10 DIAGNOSIS — Y92009 Unspecified place in unspecified non-institutional (private) residence as the place of occurrence of the external cause: Secondary | ICD-10-CM | POA: Diagnosis not present

## 2023-10-10 DIAGNOSIS — W19XXXA Unspecified fall, initial encounter: Secondary | ICD-10-CM

## 2023-10-10 DIAGNOSIS — I351 Nonrheumatic aortic (valve) insufficiency: Secondary | ICD-10-CM | POA: Diagnosis present

## 2023-10-10 DIAGNOSIS — M6282 Rhabdomyolysis: Secondary | ICD-10-CM | POA: Diagnosis present

## 2023-10-10 DIAGNOSIS — N390 Urinary tract infection, site not specified: Secondary | ICD-10-CM | POA: Diagnosis not present

## 2023-10-10 DIAGNOSIS — T796XXD Traumatic ischemia of muscle, subsequent encounter: Secondary | ICD-10-CM | POA: Diagnosis not present

## 2023-10-10 DIAGNOSIS — E876 Hypokalemia: Secondary | ICD-10-CM | POA: Diagnosis present

## 2023-10-10 DIAGNOSIS — G8929 Other chronic pain: Secondary | ICD-10-CM | POA: Diagnosis present

## 2023-10-10 DIAGNOSIS — Z888 Allergy status to other drugs, medicaments and biological substances status: Secondary | ICD-10-CM | POA: Diagnosis not present

## 2023-10-10 DIAGNOSIS — Z1152 Encounter for screening for COVID-19: Secondary | ICD-10-CM

## 2023-10-10 DIAGNOSIS — L97519 Non-pressure chronic ulcer of other part of right foot with unspecified severity: Secondary | ICD-10-CM | POA: Diagnosis present

## 2023-10-10 DIAGNOSIS — A419 Sepsis, unspecified organism: Secondary | ICD-10-CM

## 2023-10-10 DIAGNOSIS — Z79891 Long term (current) use of opiate analgesic: Secondary | ICD-10-CM | POA: Diagnosis not present

## 2023-10-10 DIAGNOSIS — A4151 Sepsis due to Escherichia coli [E. coli]: Principal | ICD-10-CM | POA: Diagnosis present

## 2023-10-10 DIAGNOSIS — I2489 Other forms of acute ischemic heart disease: Secondary | ICD-10-CM | POA: Diagnosis present

## 2023-10-10 DIAGNOSIS — Z23 Encounter for immunization: Secondary | ICD-10-CM

## 2023-10-10 DIAGNOSIS — Z79899 Other long term (current) drug therapy: Secondary | ICD-10-CM | POA: Diagnosis not present

## 2023-10-10 DIAGNOSIS — N3000 Acute cystitis without hematuria: Secondary | ICD-10-CM | POA: Diagnosis present

## 2023-10-10 DIAGNOSIS — T796XXA Traumatic ischemia of muscle, initial encounter: Secondary | ICD-10-CM | POA: Diagnosis present

## 2023-10-10 DIAGNOSIS — Z803 Family history of malignant neoplasm of breast: Secondary | ICD-10-CM

## 2023-10-10 DIAGNOSIS — E785 Hyperlipidemia, unspecified: Secondary | ICD-10-CM | POA: Diagnosis present

## 2023-10-10 DIAGNOSIS — Z880 Allergy status to penicillin: Secondary | ICD-10-CM | POA: Diagnosis not present

## 2023-10-10 DIAGNOSIS — R6520 Severe sepsis without septic shock: Secondary | ICD-10-CM | POA: Diagnosis present

## 2023-10-10 DIAGNOSIS — Z602 Problems related to living alone: Secondary | ICD-10-CM | POA: Diagnosis present

## 2023-10-10 DIAGNOSIS — L89222 Pressure ulcer of left hip, stage 2: Secondary | ICD-10-CM | POA: Diagnosis present

## 2023-10-10 DIAGNOSIS — N179 Acute kidney failure, unspecified: Secondary | ICD-10-CM | POA: Diagnosis present

## 2023-10-10 HISTORY — DX: Hypokalemia: E87.6

## 2023-10-10 HISTORY — DX: Sepsis, unspecified organism: A41.9

## 2023-10-10 HISTORY — DX: Rhabdomyolysis: M62.82

## 2023-10-10 LAB — CBC WITH DIFFERENTIAL/PLATELET
Abs Immature Granulocytes: 0.06 10*3/uL (ref 0.00–0.07)
Basophils Absolute: 0.1 10*3/uL (ref 0.0–0.1)
Basophils Relative: 0 %
Eosinophils Absolute: 0 10*3/uL (ref 0.0–0.5)
Eosinophils Relative: 0 %
HCT: 51.5 % — ABNORMAL HIGH (ref 36.0–46.0)
Hemoglobin: 17.9 g/dL — ABNORMAL HIGH (ref 12.0–15.0)
Immature Granulocytes: 0 %
Lymphocytes Relative: 4 %
Lymphs Abs: 0.5 10*3/uL — ABNORMAL LOW (ref 0.7–4.0)
MCH: 30.4 pg (ref 26.0–34.0)
MCHC: 34.8 g/dL (ref 30.0–36.0)
MCV: 87.6 fL (ref 80.0–100.0)
Monocytes Absolute: 1.3 10*3/uL — ABNORMAL HIGH (ref 0.1–1.0)
Monocytes Relative: 9 %
Neutro Abs: 11.9 10*3/uL — ABNORMAL HIGH (ref 1.7–7.7)
Neutrophils Relative %: 87 %
Platelets: 396 10*3/uL (ref 150–400)
RBC: 5.88 MIL/uL — ABNORMAL HIGH (ref 3.87–5.11)
RDW: 13.9 % (ref 11.5–15.5)
WBC: 13.8 10*3/uL — ABNORMAL HIGH (ref 4.0–10.5)
nRBC: 0 % (ref 0.0–0.2)

## 2023-10-10 LAB — COMPREHENSIVE METABOLIC PANEL
ALT: 37 U/L (ref 0–44)
AST: 71 U/L — ABNORMAL HIGH (ref 15–41)
Albumin: 3.7 g/dL (ref 3.5–5.0)
Alkaline Phosphatase: 59 U/L (ref 38–126)
Anion gap: 15 (ref 5–15)
BUN: 43 mg/dL — ABNORMAL HIGH (ref 8–23)
CO2: 27 mmol/L (ref 22–32)
Calcium: 9 mg/dL (ref 8.9–10.3)
Chloride: 93 mmol/L — ABNORMAL LOW (ref 98–111)
Creatinine, Ser: 1.08 mg/dL — ABNORMAL HIGH (ref 0.44–1.00)
GFR, Estimated: 55 mL/min — ABNORMAL LOW (ref >60)
Glucose, Bld: 131 mg/dL — ABNORMAL HIGH (ref 70–99)
Potassium: 2.4 mmol/L — CL (ref 3.5–5.1)
Sodium: 135 mmol/L (ref 135–145)
Total Bilirubin: 1.3 mg/dL — ABNORMAL HIGH (ref 0.3–1.2)
Total Protein: 7.3 g/dL (ref 6.5–8.1)

## 2023-10-10 LAB — URINALYSIS, W/ REFLEX TO CULTURE (INFECTION SUSPECTED)
Bilirubin Urine: NEGATIVE
Glucose, UA: NEGATIVE mg/dL
Ketones, ur: 5 mg/dL — AB
Nitrite: NEGATIVE
Protein, ur: >=300 mg/dL — AB
Specific Gravity, Urine: 1.016 (ref 1.005–1.030)
WBC, UA: >50 WBC/hpf (ref 0–5)
pH: 5 (ref 5.0–8.0)

## 2023-10-10 LAB — APTT: aPTT: 25 s (ref 24–36)

## 2023-10-10 LAB — TROPONIN I (HIGH SENSITIVITY)
Troponin I (High Sensitivity): 184 ng/L (ref <18)
Troponin I (High Sensitivity): 169 ng/L (ref <18)

## 2023-10-10 LAB — LACTIC ACID, PLASMA
Lactic Acid, Venous: 1.7 mmol/L (ref 0.5–1.9)
Lactic Acid, Venous: 1.8 mmol/L (ref 0.5–1.9)

## 2023-10-10 LAB — RAPID URINE DRUG SCREEN, HOSP PERFORMED
Amphetamines: NOT DETECTED
Barbiturates: NOT DETECTED
Benzodiazepines: NOT DETECTED
Cocaine: NOT DETECTED
Opiates: NOT DETECTED
Tetrahydrocannabinol: NOT DETECTED

## 2023-10-10 LAB — RESP PANEL BY RT-PCR (RSV, FLU A&B, COVID)  RVPGX2
Influenza A by PCR: NEGATIVE
Influenza B by PCR: NEGATIVE
Resp Syncytial Virus by PCR: NEGATIVE
SARS Coronavirus 2 by RT PCR: NEGATIVE

## 2023-10-10 LAB — PROTIME-INR
INR: 1.1 (ref 0.8–1.2)
Prothrombin Time: 14.5 s (ref 11.4–15.2)

## 2023-10-10 LAB — ETHANOL: Alcohol, Ethyl (B): <10 mg/dL (ref <10)

## 2023-10-10 LAB — AMMONIA: Ammonia: <10 umol/L (ref 9–35)

## 2023-10-10 LAB — MAGNESIUM: Magnesium: 2.1 mg/dL (ref 1.7–2.4)

## 2023-10-10 LAB — CK: Total CK: 2327 U/L — ABNORMAL HIGH (ref 38–234)

## 2023-10-10 MED ORDER — TETANUS-DIPHTH-ACELL PERTUSSIS 5-2.5-18.5 LF-MCG/0.5 IM SUSY
0.5000 mL | PREFILLED_SYRINGE | Freq: Once | INTRAMUSCULAR | Status: AC
  Administered 2023-10-10: 0.5 mL via INTRAMUSCULAR
  Filled 2023-10-10: qty 0.5

## 2023-10-10 MED ORDER — POTASSIUM CHLORIDE CRYS ER 20 MEQ PO TBCR
40.0000 meq | EXTENDED_RELEASE_TABLET | ORAL | Status: DC
Start: 2023-10-10 — End: 2023-10-10

## 2023-10-10 MED ORDER — SODIUM CHLORIDE 0.9 % IV BOLUS
1000.0000 mL | Freq: Once | INTRAVENOUS | Status: AC
  Administered 2023-10-10: 1000 mL via INTRAVENOUS

## 2023-10-10 MED ORDER — SODIUM CHLORIDE 0.9 % IV SOLN
2.0000 g | INTRAVENOUS | Status: DC
  Administered 2023-10-10: 2 g via INTRAVENOUS
  Filled 2023-10-10: qty 20

## 2023-10-10 MED ORDER — DICLOFENAC SODIUM 1 % EX GEL: 4.0000 g | Freq: Four times a day (QID) | CUTANEOUS | Status: DC

## 2023-10-10 MED ORDER — SODIUM CHLORIDE 0.9 % IV SOLN: INTRAVENOUS | Status: DC

## 2023-10-10 MED ORDER — DICLOFENAC SODIUM 1 % EX GEL: 4.0000 g | Freq: Four times a day (QID) | CUTANEOUS | Status: DC | PRN

## 2023-10-10 MED ORDER — POTASSIUM CHLORIDE 10 MEQ/100ML IV SOLN
10.0000 meq | INTRAVENOUS | Status: AC
  Administered 2023-10-10 (×4): 10 meq via INTRAVENOUS
  Filled 2023-10-10 (×4): qty 100

## 2023-10-10 MED ORDER — POTASSIUM CHLORIDE CRYS ER 20 MEQ PO TBCR
40.0000 meq | EXTENDED_RELEASE_TABLET | ORAL | Status: AC
  Administered 2023-10-10 – 2023-10-11 (×3): 40 meq via ORAL
  Filled 2023-10-10 (×3): qty 2

## 2023-10-10 MED ORDER — MAGNESIUM SULFATE 2 GM/50ML IV SOLN
2.0000 g | Freq: Once | INTRAVENOUS | Status: AC
  Administered 2023-10-10: 2 g via INTRAVENOUS
  Filled 2023-10-10: qty 50

## 2023-10-10 MED ORDER — HYDROCODONE-ACETAMINOPHEN 5-325 MG PO TABS
1.0000 | ORAL_TABLET | Freq: Four times a day (QID) | ORAL | Status: DC | PRN
  Administered 2023-10-10 – 2023-10-14 (×6): 1 via ORAL
  Filled 2023-10-10 (×6): qty 1

## 2023-10-10 NOTE — Assessment & Plan Note (Signed)
Patient found down, unknown how long, generally weak..  At baseline ambulates without assistance or assistive devices.  Lives alone. -PT eval -May need placement

## 2023-10-10 NOTE — ED Notes (Signed)
Open wounds noted to R inner knee and a large wound/blister on L hip. Blister noted on L foot and L shoulder. Blisters cleaned and knee, shoulder, and hip blister covered with padding

## 2023-10-10 NOTE — ED Notes (Signed)
Pt asked about her dog and cats, expresses concern and worry about them. Asked where they took her dog, this RN informed pt of what [facility] informed us about dog going to animal shelter

## 2023-10-10 NOTE — ED Notes (Signed)
Attempted to call pts niece again, unable to reach anyone at this time

## 2023-10-10 NOTE — ED Notes (Signed)
Per [facility], [place] Police, Mr. Barbara Eaton, welfare check perfromed and pt had a dog at home, took pts dog to [facility] and after 5pm police will take pts dog to [employer] for free

## 2023-10-10 NOTE — ED Notes (Signed)
Both sets of blood cultures drawn before any antibiotic administration  

## 2023-10-10 NOTE — ED Notes (Addendum)
Pt visitor at bedside is a friend, Lavella Lemons, who pt states can be called for updates and plan of care. Carolyn's number is [phone]. Added to contact list as pt requested her friend be added

## 2023-10-10 NOTE — Assessment & Plan Note (Addendum)
Patient found down.  Nontraumatic.  CK 2327  -Repeat CK in a.m. - Hydrate - Hold statin

## 2023-10-10 NOTE — ED Notes (Signed)
Full bed bath given to pt upon arrival, pt covered in urine and feces. Peri care also performed.

## 2023-10-10 NOTE — ED Notes (Addendum)
Pt resting comfortably, given warm blanket, visitor at bedside

## 2023-10-10 NOTE — Assessment & Plan Note (Addendum)
Meeting sepsis criteria with tachycardia, leukocytosis of 13.8.  Lactic acid normal 1.7.  UA suggestive of UTI -Follow-up blood and urine cultures. -Continue IV ceftriaxone -1 L bolus given, continue N/s 100cc/hr x 15hrs - Possible thyroid enlargement- Check TSH, T3, T4

## 2023-10-10 NOTE — ED Notes (Signed)
Attempted to call pts niece, kelly, per pts requests; However, unable to reach her at this time

## 2023-10-10 NOTE — Assessment & Plan Note (Signed)
Potassium 2.4.  Magnesium 2.1 - Replete

## 2023-10-10 NOTE — ED Notes (Signed)
Skin tears and raw areas noted on the skin of bilateral legs and feet. Pt is A&O and is able to tell me the year and what hospital she is at as well as the situation

## 2023-10-10 NOTE — H&P (Addendum)
History and Physical    Barbara Eaton ZOX:[id number] DOB: 1951-03-12 DOA: 10/10/2023  PCP: Pcp, No   Patient coming from: Home  I have personally briefly reviewed patient's old medical records in [facility]  Chief Complaint: Found Down  HPI: Barbara Eaton is a 72 y.o. female with medical history significant for dyslipidemia.  Patient was brought to the ED via EMS after law enforcement had to make a forced entry into patient's home for a welfare check.  Patient was found on the floor.  She was awake, covered in urine and feces.  At the time of my evaluation patient is weak and alert and able to answer questions appropriately.  She does not know how long she was down, and does not remember how she got on the floor.  She reports chronic pain to her that is unchanged.  Reports some pain with urination.  Patient lives alone.  ED Course: Temperature 97.9.  Heart rate 81-108.  Respirate rate 18-23.  Blood pressure systolic 120s to 811B.  WBC 13.8.  Lactic acid 1.7.  Troponin 184 > 169. CK 2327. UA suggestive of UTI. Imaging included head CT, cervical CT, chest x-ray, right knee x-ray, right and left foot x-ray, pelvic x-ray and left shoulder x-ray, which are negative for acute abnormalities or fractures. 1 L bolus given, N/s @100c /hr started. Blood and urine cultures obtained. Ceftriaxone started.  Review of Systems: As per HPI all other systems reviewed and negative.  History reviewed. No pertinent past medical history.  Past Surgical History:  Procedure Laterality Date   AUGMENTATION MAMMAPLASTY Bilateral 2013-14   [place], In front of muscle. Bilateral lift     has no history on file for tobacco use, alcohol use, and drug use.  Allergies  Allergen Reactions   Penicillins Hives, Rash and Other (See Comments)    Childhood allergy   Penciclovir Rash    Hives   Alendronate Nausea And Vomiting and Nausea Only    GI intolerance    Family History  Problem Relation Age  of Onset   Breast cancer Mother 24    Prior to Admission medications   Medication Sig Start Date End Date Taking? Authorizing Provider  atorvastatin (LIPITOR) 10 MG tablet Take 1 tablet by mouth daily. 12/27/16  Yes [provider]  estradiol (ESTRACE) 0.1 MG/GM vaginal cream Place 1 g vaginally 3 (three) times a week.   Yes [provider]  ibuprofen (ADVIL) 200 MG tablet Take 200 mg by mouth every 6 (six) hours as needed for mild pain (pain score 1-3).   Yes [provider]  Multiple Vitamin (MULTI-VITAMIN) tablet Take 1 tablet by mouth every morning.   Yes [provider]    Physical Exam: Vitals:   10/10/23 1800 10/10/23 1815 10/10/23 1830 10/10/23 1845  BP: 127/70 (!) 141/71 139/69 136/73  Pulse: 98 95 90 95  Resp: (!) 21 18 (!) 21 (!) 23  Temp:      TempSrc:      SpO2: 95% 97% 96% 98%  Weight:      Height:        Constitutional: NAD, calm, comfortable Vitals:   10/10/23 1800 10/10/23 1815 10/10/23 1830 10/10/23 1845  BP: 127/70 (!) 141/71 139/69 136/73  Pulse: 98 95 90 95  Resp: (!) 21 18 (!) 21 (!) 23  Temp:      TempSrc:      SpO2: 95% 97% 96% 98%  Weight:      Height:  Eyes: PERRL, lids and conjunctivae normal ENMT: Mucous membranes are dry.  Neck: normal, supple, no masses, anterior aspect of her neck appears enlarged, patient unaware Respiratory: clear to auscultation bilaterally, no wheezing, no crackles. Normal respiratory effort. No accessory muscle use.  Cardiovascular: Regular rate and rhythm, known 3/6 cardiac murmur / rubs / gallops. Trace pitting extremity edema. Extremities warm Abdomen: no tenderness, no masses palpated. No hepatosplenomegaly. Bowel sounds positive.  Musculoskeletal: no clubbing / cyanosis. No joint deformity upper and lower extremities.  Skin: Skin tear/abrasion to skin , No induration Neurologic: No facial asymmetry, 4+/ 5 strength bilateral upper extremity, 3+/5 strength bilateral lower  extremity. Psychiatric: Normal judgment and insight. Alert and oriented x 3. Normal mood.   Labs on Admission: I have personally reviewed following labs and imaging studies  CBC: Recent Labs  Lab 10/10/23 1304  WBC 13.8*  NEUTROABS 11.9*  HGB 17.9*  HCT 51.5*  MCV 87.6  PLT 396   Basic Metabolic Panel: Recent Labs  Lab 10/10/23 1304 10/10/23 1447  NA 135  --   K 2.4*  --   CL 93*  --   CO2 27  --   GLUCOSE 131*  --   BUN 43*  --   CREATININE 1.08*  --   CALCIUM 9.0  --   MG  --  2.1   GFR: Estimated Creatinine Clearance: 41.6 mL/min (A) (by C-G formula based on SCr of 1.08 mg/dL (H)). Liver Function Tests: Recent Labs  Lab 10/10/23 1304  AST 71*  ALT 37  ALKPHOS 59  BILITOT 1.3*  PROT 7.3  ALBUMIN 3.7   No results for input(s): "LIPASE", "AMYLASE" in the last 168 hours. Recent Labs  Lab 10/10/23 1304  AMMONIA <10   Coagulation Profile: Recent Labs  Lab 10/10/23 1304  INR 1.1   Cardiac Enzymes: Recent Labs  Lab 10/10/23 1304  CKTOTAL 2,327*   Urine analysis:    Component Value Date/Time   COLORURINE AMBER (A) 10/10/2023 1311   APPEARANCEUR CLOUDY (A) 10/10/2023 1311   LABSPEC 1.016 10/10/2023 1311   PHURINE 5.0 10/10/2023 1311   GLUCOSEU NEGATIVE 10/10/2023 1311   HGBUR MODERATE (A) 10/10/2023 1311   BILIRUBINUR NEGATIVE 10/10/2023 1311   KETONESUR 5 (A) 10/10/2023 1311   PROTEINUR >=300 (A) 10/10/2023 1311   NITRITE NEGATIVE 10/10/2023 1311   LEUKOCYTESUR MODERATE (A) 10/10/2023 1311    Radiological Exams on Admission: CT Head Wo Contrast  Result Date: 10/10/2023 CLINICAL DATA:  Found down EXAM: CT HEAD WITHOUT CONTRAST CT CERVICAL SPINE WITHOUT CONTRAST TECHNIQUE: Multidetector CT imaging of the head and cervical spine was performed following the standard protocol without intravenous contrast. Multiplanar CT image reconstructions of the cervical spine were also generated. RADIATION DOSE REDUCTION: This exam was performed according to  the departmental dose-optimization program which includes automated exposure control, adjustment of the mA and/or kV according to patient size and/or use of iterative reconstruction technique. COMPARISON:  None Available. FINDINGS: CT HEAD FINDINGS Brain: There is no acute intracranial hemorrhage, extra-axial fluid collection, or acute infarct. Parenchymal volume is normal for age. The ventricles are normal in size. Gray-white differentiation is preserved. Patchy hypodensity in the supratentorial white matter likely reflects sequela of underlying chronic small-vessel ischemic change. There is no mass lesion.  There is no mass effect or midline shift. Vascular: No hyperdense vessel or unexpected calcification. Skull: Normal. Negative for fracture or focal lesion. Sinuses/Orbits: The paranasal sinuses are clear. Bilateral lens implants are in place. The globes and orbits are  otherwise unremarkable. Other: The mastoid air cells and middle ear cavities are clear. CT CERVICAL SPINE FINDINGS Alignment: There is exaggerated cervical lordosis. There is no evidence of traumatic malalignment. Skull base and vertebrae: Skull base alignment is maintained. Vertebral body heights are preserved. There is no evidence of acute fracture. There is no suspicious osseous lesion. Soft tissues and spinal canal: No prevertebral fluid or swelling. No visible canal hematoma. Disc levels: There is mild degenerative endplate change and left facet arthropathy at C3-C4. There is no evidence of high-grade spinal canal or neural foraminal stenosis. Upper chest: The imaged lung apices are clear. Other: None. IMPRESSION: 1. No acute intracranial hemorrhage or calvarial fracture. 2. No acute fracture or traumatic malalignment of the cervical spine. Electronically Signed   By: Lesia Hausen M.D.   On: 10/10/2023 16:21   CT Cervical Spine Wo Contrast  Result Date: 10/10/2023 CLINICAL DATA:  Found down EXAM: CT HEAD WITHOUT CONTRAST CT CERVICAL  SPINE WITHOUT CONTRAST TECHNIQUE: Multidetector CT imaging of the head and cervical spine was performed following the standard protocol without intravenous contrast. Multiplanar CT image reconstructions of the cervical spine were also generated. RADIATION DOSE REDUCTION: This exam was performed according to the departmental dose-optimization program which includes automated exposure control, adjustment of the mA and/or kV according to patient size and/or use of iterative reconstruction technique. COMPARISON:  None Available. FINDINGS: CT HEAD FINDINGS Brain: There is no acute intracranial hemorrhage, extra-axial fluid collection, or acute infarct. Parenchymal volume is normal for age. The ventricles are normal in size. Gray-white differentiation is preserved. Patchy hypodensity in the supratentorial white matter likely reflects sequela of underlying chronic small-vessel ischemic change. There is no mass lesion.  There is no mass effect or midline shift. Vascular: No hyperdense vessel or unexpected calcification. Skull: Normal. Negative for fracture or focal lesion. Sinuses/Orbits: The paranasal sinuses are clear. Bilateral lens implants are in place. The globes and orbits are otherwise unremarkable. Other: The mastoid air cells and middle ear cavities are clear. CT CERVICAL SPINE FINDINGS Alignment: There is exaggerated cervical lordosis. There is no evidence of traumatic malalignment. Skull base and vertebrae: Skull base alignment is maintained. Vertebral body heights are preserved. There is no evidence of acute fracture. There is no suspicious osseous lesion. Soft tissues and spinal canal: No prevertebral fluid or swelling. No visible canal hematoma. Disc levels: There is mild degenerative endplate change and left facet arthropathy at C3-C4. There is no evidence of high-grade spinal canal or neural foraminal stenosis. Upper chest: The imaged lung apices are clear. Other: None. IMPRESSION: 1. No acute intracranial  hemorrhage or calvarial fracture. 2. No acute fracture or traumatic malalignment of the cervical spine. Electronically Signed   By: Lesia Hausen M.D.   On: 10/10/2023 16:21   DG Foot Complete Left  Result Date: 10/10/2023 CLINICAL DATA:  Fall, pain. EXAM: LEFT FOOT - COMPLETE 3+ VIEW COMPARISON:  None Available. FINDINGS: There is no evidence of fracture or dislocation. There is no evidence of arthropathy or other focal bone abnormality. Surgical hardware in the distal tibia and fibula, partially included. There is a plantar calcaneal spur. Soft tissues are unremarkable. IMPRESSION: No fracture or dislocation of the left foot. Electronically Signed   By: Narda Rutherford M.D.   On: 10/10/2023 15:41   DG Foot Complete Right  Result Date: 10/10/2023 CLINICAL DATA:  Fall, pain. EXAM: RIGHT FOOT COMPLETE - 3+ VIEW COMPARISON:  None Available. FINDINGS: There is no evidence of fracture or dislocation. Diminutive plantar calcaneal  spur. There is no evidence of arthropathy or other focal bone abnormality. Soft tissues are unremarkable. IMPRESSION: No fracture or subluxation of the right foot. Electronically Signed   By: Narda Rutherford M.D.   On: 10/10/2023 15:40   DG Knee Complete 4 Views Right  Result Date: 10/10/2023 CLINICAL DATA:  Fall, pain. EXAM: RIGHT KNEE - COMPLETE 4+ VIEW COMPARISON:  None Available. FINDINGS: No evidence of fracture, dislocation, or joint effusion. The joint spaces are preserved. There are vascular calcifications. Mild anterior soft tissue edema. IMPRESSION: Mild anterior soft tissue edema. No fracture or subluxation. Electronically Signed   By: Narda Rutherford M.D.   On: 10/10/2023 15:40   DG Chest Portable 1 View  Result Date: 10/10/2023 CLINICAL DATA:  Fall, pain. EXAM: PORTABLE CHEST 1 VIEW COMPARISON:  09/10/2023 FINDINGS: The cardiomediastinal contours are stable, unchanged aortic tortuosity. The lungs are clear. Pulmonary vasculature is normal. No consolidation,  pleural effusion, or pneumothorax. No acute osseous abnormalities are seen. IMPRESSION: No acute chest findings. Electronically Signed   By: Narda Rutherford M.D.   On: 10/10/2023 15:39   DG Hips Bilat W or Wo Pelvis 3-4 Views  Result Date: 10/10/2023 CLINICAL DATA:  Fall with hip pain. EXAM: DG HIP (WITH OR WITHOUT PELVIS) 3-4V BILAT COMPARISON:  Right hip radiograph 09/07/2023 FINDINGS: No acute fracture of the pelvis or hips. Chronic right hip arthropathy with deformity of the femoral head and abnormal appearance of the hip joint. This is stable from radiographs last month. The pubic rami are intact. No pubic symphyseal or sacroiliac diastasis. IMPRESSION: 1. No acute fracture of the pelvis or hips. 2. Chronic right hip arthropathy. Electronically Signed   By: Narda Rutherford M.D.   On: 10/10/2023 15:38   DG Shoulder Left  Result Date: 10/10/2023 CLINICAL DATA:  Fall today with pain. EXAM: LEFT SHOULDER - 2+ VIEW COMPARISON:  None Available. FINDINGS: There is no evidence of fracture or dislocation. Joint spaces are preserved. There is no evidence of arthropathy or other focal bone abnormality. Soft tissues are unremarkable. IMPRESSION: No fracture or dislocation of the left shoulder. Electronically Signed   By: Narda Rutherford M.D.   On: 10/10/2023 15:37    EKG: Independently reviewed.  Sinus tachycardia rate 108, QTc 435.  No significant abnormality.  No prior to compare  Assessment/Plan Principal Problem:   Sepsis secondary to UTI [facility]) Active Problems:   Fall at home, initial encounter   Hypokalemia   Elevated troponin   Rhabdomyolysis   Assessment and Plan: * Sepsis secondary to UTI [facility] Meeting sepsis criteria with tachycardia, leukocytosis of 13.8.  Lactic acid normal 1.7.  UA suggestive of UTI -Follow-up blood and urine cultures. -Continue IV ceftriaxone -1 L bolus given, continue N/s 100cc/hr x 15hrs - Possible thyroid enlargement- Check TSH, T3, T4  Elevated  troponin Troponin 184 >> 169.  Likely demand ischemia.  She denies chest pain or difficulty breathing.  EKG unchanged.  History of aortic valve insufficiency otherwise no cardiac history.  - ECHO  Hypokalemia Potassium 2.4.  Magnesium 2.1 - Replete  Fall at home, initial encounter Patient found down, unknown how long, generally weak..  At baseline ambulates without assistance or assistive devices.  Lives alone. -PT eval -May need placement  Rhabdomyolysis Patient found down.  Nontraumatic.  CK 2327  -Repeat CK in a.m. - Hydrate - Hold statin   DVT prophylaxis: Lovenox Code Status: Full code Family Communication: None at bedside  disposition Plan: > 2 days Consults called: None  Admission status: Inpt Tele I certify that at the point of admission it is my clinical judgment that the patient will require inpatient hospital care spanning beyond 2 midnights from the point of admission due to high intensity of service, high risk for further deterioration and high frequency of surveillance required.   Author: Onnie Boer, MD 10/10/2023 9:43 PM  For on call review [email or web address].

## 2023-10-10 NOTE — Assessment & Plan Note (Addendum)
Troponin 184 >> 169.  Likely demand ischemia.  She denies chest pain or difficulty breathing.  EKG unchanged.  History of aortic valve insufficiency otherwise no cardiac history.  - ECHO

## 2023-10-10 NOTE — ED Notes (Signed)
Pts purse at bedside with her, states she would like to keep it at bedside

## 2023-10-10 NOTE — ED Triage Notes (Signed)
Pt arrived via [facility] from home, per EMS, law enforcement made forced entry into home due to someone calling a welfare check on pt, pt was found in the floor, unsure how long she has been laying in the floor. C/o R hip pain but states that is normal b/c she has been needing a R hip replacement for a while. Also c/o neck pain. When asked pt if she hit her head pt states "not that I know of" pt is A&Ox 4. Per EMS vitals all WNL except HR running tachy at 115. Strong odor of urine on pt

## 2023-10-10 NOTE — ED Provider Notes (Signed)
[place] EMERGENCY DEPARTMENT AT [facility] Provider Note  CSN: [id number] Arrival date & time: 10/10/23 1037  Chief Complaint(s) Fall  HPI Barbara Eaton is a 72 y.o. female with past medical history as below, significant for aortic insufficiency, who presents to the ED with complaint of fall  Patient is a poor historian, she is unsure about the circumstances surrounding her arrival to the emergency department today.  A neighbor called PD for welfare check and patient did not answer the door.  PD had to make forced entry into the home and patient was on the floor with close soaked in urine.  She is unsure how long she has been on the floor but thinks she may have fallen at some point.  He is having some pain to her hips which she reports is chronic and unchanged from prior.  Pain to her feet which is worsened today.  Chest pain or dyspnea, no nausea or vomiting.  Does report some burning with urination.  He lives alone, family out of state  Past Medical History History reviewed. No pertinent past medical history. There are no problems to display for this patient.  Home Medication(s) Prior to Admission medications   Medication Sig Start Date End Date Taking? Authorizing Provider  losartan (COZAAR) 50 MG tablet Take 1 tablet by mouth daily. 09/08/23 09/07/24 Yes [provider]  meloxicam (MOBIC) 7.5 MG tablet Take 1 tablet by mouth daily. 10/05/23  Yes [provider]  amLODipine (NORVASC) 5 MG tablet Take 1 tablet by mouth daily. 07/04/17   [provider]  atorvastatin (LIPITOR) 10 MG tablet Take 1 tablet by mouth daily. 12/27/16   [provider]  estradiol (ESTRACE) 0.1 MG/GM vaginal cream Place 1 g vaginally 3 (three) times a week.    [provider]  ibuprofen (ADVIL) 200 MG tablet Take 200 mg by mouth every 6 (six) hours as needed for mild pain (pain score 1-3).    [provider]  Multiple Vitamin (MULTI-VITAMIN) tablet  Take 1 tablet by mouth every morning.    [provider]  traMADol (ULTRAM) 50 MG tablet Take 1 tablet (50 mg total) by mouth every 6 (six) hours as needed. 09/10/23   Terrilee Files, MD                                                                                                                                    Past Surgical History Past Surgical History:  Procedure Laterality Date   AUGMENTATION MAMMAPLASTY Bilateral 2013-14   [place], In front of muscle. Bilateral lift   Family History Family History  Problem Relation Age of Onset   Breast cancer Mother 30    Social History   Allergies Penicillins, Penciclovir, and Alendronate  Review of Systems Review of Systems  Constitutional:  Negative for chills and fever.  HENT:  Negative for congestion.   Respiratory:  Negative for  chest tightness and shortness of breath.   Cardiovascular:  Negative for chest pain.  Gastrointestinal:  Negative for abdominal pain, nausea and vomiting.  Musculoskeletal:  Positive for arthralgias and gait problem.  Psychiatric/Behavioral:  Positive for confusion.   All other systems reviewed and are negative.   Physical Exam Vital Signs  I have reviewed the triage vital signs BP (!) 140/72   Pulse 74   Temp 97.9 F (36.6 C) (Rectal)   Resp 16   Ht 5\' 1"  (1.549 m)   Wt 68 kg   SpO2 96%   BMI 28.34 kg/m  Physical Exam Vitals and nursing note reviewed.  Constitutional:      General: She is not in acute distress.    Appearance: Normal appearance.     Comments: Malodorous   HENT:     Head: Normocephalic and atraumatic. No raccoon eyes, Battle's sign, right periorbital erythema or left periorbital erythema.     Jaw: There is normal jaw occlusion.     Right Ear: External ear normal.     Left Ear: External ear normal.     Nose: Nose normal.     Mouth/Throat:     Mouth: Mucous membranes are moist.  Eyes:     General: No scleral icterus.       Right eye: No discharge.         Left eye: No discharge.     Extraocular Movements: Extraocular movements intact.     Pupils: Pupils are equal, round, and reactive to light.  Cardiovascular:     Rate and Rhythm: Regular rhythm. Tachycardia present.     Pulses: Normal pulses.     Heart sounds: Normal heart sounds.  Pulmonary:     Effort: Pulmonary effort is normal. No respiratory distress.     Breath sounds: Normal breath sounds. No stridor.  Abdominal:     General: Abdomen is flat. There is no distension.     Palpations: Abdomen is soft.     Tenderness: There is no abdominal tenderness.  Musculoskeletal:       Arms:     Cervical back: No rigidity.     Right lower leg: No edema.     Left lower leg: No edema.       Legs:     Comments: Pain to toes   DP 2 + bl  Skin:    General: Skin is warm and dry.     Capillary Refill: Capillary refill takes less than 2 seconds.  Neurological:     Mental Status: She is alert and oriented to person, place, and time.     GCS: GCS eye subscore is 4. GCS verbal subscore is 5. GCS motor subscore is 6.     Cranial Nerves: Cranial nerves 2-12 are intact. No dysarthria.     Sensory: Sensation is intact.     Motor: Motor function is intact. No weakness.     Coordination: Coordination is intact. Finger-Nose-Finger Test normal.     Comments: Gait testing deferred secondary to patient safety.   Psychiatric:        Mood and Affect: Mood normal.        Behavior: Behavior normal. Behavior is cooperative.              ED Results and Treatments Labs (all labs ordered are listed, but only abnormal results are displayed) Labs Reviewed  CBC WITH DIFFERENTIAL/PLATELET - Abnormal; Notable for the following components:      Result Value  WBC 13.8 (*)    RBC 5.88 (*)    Hemoglobin 17.9 (*)    HCT 51.5 (*)    Neutro Abs 11.9 (*)    Lymphs Abs 0.5 (*)    Monocytes Absolute 1.3 (*)    All other components within normal limits  COMPREHENSIVE METABOLIC PANEL - Abnormal; Notable  for the following components:   Potassium 2.4 (*)    Chloride 93 (*)    Glucose, Bld 131 (*)    BUN 43 (*)    Creatinine, Ser 1.08 (*)    AST 71 (*)    Total Bilirubin 1.3 (*)    GFR, Estimated 55 (*)    All other components within normal limits  URINALYSIS, W/ REFLEX TO CULTURE (INFECTION SUSPECTED) - Abnormal; Notable for the following components:   Color, Urine AMBER (*)    APPearance CLOUDY (*)    Hgb urine dipstick MODERATE (*)    Ketones, ur 5 (*)    Protein, ur >=300 (*)    Leukocytes,Ua MODERATE (*)    Bacteria, UA MANY (*)    All other components within normal limits  CK - Abnormal; Notable for the following components:   Total CK 2,327 (*)    All other components within normal limits  TROPONIN I (HIGH SENSITIVITY) - Abnormal; Notable for the following components:   Troponin I (High Sensitivity) 184 (*)    All other components within normal limits  TROPONIN I (HIGH SENSITIVITY) - Abnormal; Notable for the following components:   Troponin I (High Sensitivity) 169 (*)    All other components within normal limits  CULTURE, BLOOD (ROUTINE X 2)  CULTURE, BLOOD (ROUTINE X 2)  URINE CULTURE  RESP PANEL BY RT-PCR (RSV, FLU A&B, COVID)  RVPGX2  LACTIC ACID, PLASMA  LACTIC ACID, PLASMA  AMMONIA  ETHANOL  RAPID URINE DRUG SCREEN, HOSP PERFORMED  MAGNESIUM  PROTIME-INR  APTT                                                                                                                          Radiology  Pertinent labs & imaging results that were available during my care of the patient were reviewed by me and considered in my medical decision making (see MDM for details).  Medications Ordered in ED Medications  potassium chloride 10 mEq in 100 mL IVPB (10 mEq Intravenous New Bag/Given 10/10/23 1622)  cefTRIAXone (ROCEPHIN) 2 g in sodium chloride 0.9 % 100 mL IVPB (0 g Intravenous Stopped 10/10/23 1531)  sodium chloride 0.9 % bolus 1,000 mL (0 mLs Intravenous Stopped  10/10/23 1544)  magnesium sulfate IVPB 2 g 50 mL (0 g Intravenous Stopped 10/10/23 1531)  Tdap (BOOSTRIX) injection 0.5 mL (0.5 mLs Intramuscular Given 10/10/23 1505)  Procedures .Critical Care  Performed by: Sloan Leiter, DO Authorized by: Sloan Leiter, DO   Critical care provider statement:    Critical care time (minutes):  44   Critical care time was exclusive of:  Separately billable procedures and treating other patients   Critical care was necessary to treat or prevent imminent or life-threatening deterioration of the following conditions:  Sepsis   Critical care was time spent personally by me on the following activities:  Development of treatment plan with patient or surrogate, discussions with consultants, evaluation of patient's response to treatment, examination of patient, ordering and review of laboratory studies, ordering and review of radiographic studies, ordering and performing treatments and interventions, pulse oximetry, re-evaluation of patient's condition, review of old charts and obtaining history from patient or surrogate   (including critical care time)  Medical Decision Making / ED Course    Medical Decision Making:    Barbara Eaton is a 72 y.o. female with past medical history as below, significant for aortic insufficiency, who presents to the ED with complaint of fall. The complaint involves an extensive differential diagnosis and also carries with it a high risk of complications and morbidity.  Serious etiology was considered. Ddx includes but is not limited to: Infectious, metabolic derangement, ACS, CVA, fracture, soft tissue injury etc.  Complete initial physical exam performed, notably the patient  was however elevated, no hypoxia.    Reviewed and confirmed nursing documentation for past medical history, family history,  social history.  Vital signs reviewed.    Clinical Course as of 10/10/23 1633  Mon Oct 10, 2023  1418 Troponin I (High Sensitivity)(!!): 184 EKG w/o stemi, no cp [SG]  1418 CK Total(!): 2,327 Prolonged down time, wounds to extremities, Cr okay, concern rhabdomyolysis, start IVF [SG]  1418 Potassium(!!): 2.4 Replacement ordered w/ mgso4 [SG]  1418 Hemoglobin(!): 17.9 Appears dry on exam [SG]  1435 Source of infection identified, urine; she has wbc 13.8, tachycardia, tachypnea. ?end organ damage with trop++. Code sepsis alerted. BP stable, does not appear to be septic shock currently  [SG]  1435 BUN(!): 43 Continue IVF [SG]  1436 Multiple wounds likely from fall, pressure wounds; update tetanus [SG]    Clinical Course User Index [SG] Sloan Leiter, DO     She has  multiple pressure wounds to her extremities, worse appears to be at left lateral hip.  Will collect screening labs, get imaging to assess for acute process.  Patient will require admission  Signed out to incoming EDP pending imaging and eventual admission                 Additional history obtained: -Additional history obtained from na -External records from outside source obtained and reviewed including: Chart review including previous notes, labs, imaging, consultation notes including  Prior ed visit, home meds, primary care documentation    Lab Tests: -I ordered, reviewed, and interpreted labs.   The pertinent results include:   Labs Reviewed  CBC WITH DIFFERENTIAL/PLATELET - Abnormal; Notable for the following components:      Result Value   WBC 13.8 (*)    RBC 5.88 (*)    Hemoglobin 17.9 (*)    HCT 51.5 (*)    Neutro Abs 11.9 (*)    Lymphs Abs 0.5 (*)    Monocytes Absolute 1.3 (*)    All other components within normal limits  COMPREHENSIVE METABOLIC PANEL - Abnormal; Notable for the following components:   Potassium 2.4 (*)  Chloride 93 (*)    Glucose, Bld 131 (*)    BUN 43 (*)     Creatinine, Ser 1.08 (*)    AST 71 (*)    Total Bilirubin 1.3 (*)    GFR, Estimated 55 (*)    All other components within normal limits  URINALYSIS, W/ REFLEX TO CULTURE (INFECTION SUSPECTED) - Abnormal; Notable for the following components:   Color, Urine AMBER (*)    APPearance CLOUDY (*)    Hgb urine dipstick MODERATE (*)    Ketones, ur 5 (*)    Protein, ur >=300 (*)    Leukocytes,Ua MODERATE (*)    Bacteria, UA MANY (*)    All other components within normal limits  CK - Abnormal; Notable for the following components:   Total CK 2,327 (*)    All other components within normal limits  TROPONIN I (HIGH SENSITIVITY) - Abnormal; Notable for the following components:   Troponin I (High Sensitivity) 184 (*)    All other components within normal limits  TROPONIN I (HIGH SENSITIVITY) - Abnormal; Notable for the following components:   Troponin I (High Sensitivity) 169 (*)    All other components within normal limits  CULTURE, BLOOD (ROUTINE X 2)  CULTURE, BLOOD (ROUTINE X 2)  URINE CULTURE  RESP PANEL BY RT-PCR (RSV, FLU A&B, COVID)  RVPGX2  LACTIC ACID, PLASMA  LACTIC ACID, PLASMA  AMMONIA  ETHANOL  RAPID URINE DRUG SCREEN, HOSP PERFORMED  MAGNESIUM  PROTIME-INR  APTT    Notable for sepsis  EKG   EKG Interpretation Date/Time:  Monday October 10 2023 11:58:19 EDT Ventricular Rate:  108 PR Interval:  126 QRS Duration:  88 QT Interval:  324 QTC Calculation: 435 R Axis:   19  Text Interpretation: Sinus tachycardia Probable LVH with secondary repol abnrm no stemi Confirmed by Tanda Rockers ([id number]) on 10/10/2023 2:19:11 PM         Imaging Studies ordered: I ordered imaging studies including CT head/neck, XR chest/extremities  I independently visualized the following imaging with scope of interpretation limited to determining acute life threatening conditions related to emergency care; findings noted above I independently visualized and interpreted imaging. I agree with  the radiologist interpretation   Medicines ordered and prescription drug management: Meds ordered this encounter  Medications   sodium chloride 0.9 % bolus 1,000 mL   potassium chloride 10 mEq in 100 mL IVPB   magnesium sulfate IVPB 2 g 50 mL   cefTRIAXone (ROCEPHIN) 2 g in sodium chloride 0.9 % 100 mL IVPB    Order Specific Question:   Antibiotic Indication:    Answer:   UTI   Tdap (BOOSTRIX) injection 0.5 mL    -I have reviewed the patients home medicines and have made adjustments as needed   Consultations Obtained: na   Cardiac Monitoring: The patient was maintained on a cardiac monitor.  I personally viewed and interpreted the cardiac monitored which showed an underlying rhythm of: NSR  Social Determinants of Health:  Diagnosis or treatment significantly limited by social determinants of health: no pcp, lives alone    Reevaluation: After the interventions noted above, I reevaluated the patient and found that they have improved  Co morbidities that complicate the patient evaluation History reviewed. No pertinent past medical history.    Dispostion: Disposition decision including need for hospitalization was considered, and patient disposition pending at time of sign out.    Final Clinical Impression(s) / ED Diagnoses Final diagnoses:  Sepsis, due to  unspecified organism, unspecified whether acute organ dysfunction present [facility])  Traumatic rhabdomyolysis, initial encounter (HCC)  Acute cystitis without hematuria        Sloan Leiter, DO 10/10/23 1633

## 2023-10-10 NOTE — ED Notes (Signed)
ED TO INPATIENT HANDOFF REPORT  ED Nurse Name and Phone #:  Alphonzo Lemmings, RN  S Name/Age/Gender Barbara Eaton 72 y.o. female Room/Bed: APA03/APA03  Code Status   Code Status: Not on file  Home/SNF/Other Home Patient oriented to: self, place, time, and situation Is this baseline? Yes   Triage Complete: Triage complete  Chief Complaint Sepsis [facility]) [A41.9]  Triage Note Pt arrived via [facility] from home, per EMS, law enforcement made forced entry into home due to someone calling a welfare check on pt, pt was found in the floor, unsure how long she has been laying in the floor. C/o R hip pain but states that is normal b/c she has been needing a R hip replacement for a while. Also c/o neck pain. When asked pt if she hit her head pt states "not that I know of" pt is A&Ox 4. Per EMS vitals all WNL except HR running tachy at 115. Strong odor of urine on pt    Allergies Allergies  Allergen Reactions   Penicillins Hives, Rash and Other (See Comments)    Childhood allergy   Penciclovir Rash    Hives   Alendronate Nausea And Vomiting and Nausea Only    GI intolerance    Level of Care/Admitting Diagnosis ED Disposition     ED Disposition  Admit   Condition  --   Comment  Hospital Area: [facility] [[id number]]  Level of Care: Telemetry [5]  Covid Evaluation: Asymptomatic - no recent exposure (last 10 days) testing not required  Diagnosis: Sepsis [facility]) [[id number]]  Admitting Physician: Cresenciano Lick  Attending Physician: Onnie Boer [phone]  Certification:: I certify this patient will need inpatient services for at least 2 midnights  Expected Medical Readiness: 10/12/2023          B Medical/Surgery History History reviewed. No pertinent past medical history. Past Surgical History:  Procedure Laterality Date   AUGMENTATION MAMMAPLASTY Bilateral 2013-14   [place]or, In front of muscle. Bilateral lift     A IV Location/Drains/Wounds Patient  Lines/Drains/Airways Status     Active Line/Drains/Airways     Name Placement date Placement time Site Days   Peripheral IV 10/10/23 20 G 1" Right Antecubital 10/10/23  1359  Antecubital  less than 1   Peripheral IV 10/10/23 20 G 1" Left Antecubital 10/10/23  1456  Antecubital  less than 1   External Urinary Catheter 10/10/23  1320  --  less than 1            Intake/Output Last 24 hours  Intake/Output Summary (Last 24 hours) at 10/10/2023 1949 Last data filed at 10/10/2023 1319 Gross per 24 hour  Intake --  Output 100 ml  Net -100 ml    Labs/Imaging Results for orders placed or performed during the hospital encounter of 10/10/23 (from the past 48 hour(s))  CBC with Differential     Status: Abnormal   Collection Time: 10/10/23  1:04 PM  Result Value Ref Range   WBC 13.8 (H) 4.0 - 10.5 K/uL   RBC 5.88 (H) 3.87 - 5.11 MIL/uL   Hemoglobin 17.9 (H) 12.0 - 15.0 g/dL   HCT 98.1 (H) 19.1 - 47.8 %   MCV 87.6 80.0 - 100.0 fL   MCH 30.4 26.0 - 34.0 pg   MCHC 34.8 30.0 - 36.0 g/dL   RDW 29.5 62.1 - 30.8 %   Platelets 396 150 - 400 K/uL   nRBC 0.0 0.0 - 0.2 %   Neutrophils Relative %  87 %   Neutro Abs 11.9 (H) 1.7 - 7.7 K/uL   Lymphocytes Relative 4 %   Lymphs Abs 0.5 (L) 0.7 - 4.0 K/uL   Monocytes Relative 9 %   Monocytes Absolute 1.3 (H) 0.1 - 1.0 K/uL   Eosinophils Relative 0 %   Eosinophils Absolute 0.0 0.0 - 0.5 K/uL   Basophils Relative 0 %   Basophils Absolute 0.1 0.0 - 0.1 K/uL   Immature Granulocytes 0 %   Abs Immature Granulocytes 0.06 0.00 - 0.07 K/uL    Comment: Performed at [facility], [address] [place], [place] [address]  Lactic acid, plasma     Status: None   Collection Time: 10/10/23  1:04 PM  Result Value Ref Range   Lactic Acid, Venous 1.7 0.5 - 1.9 mmol/L    Comment: Performed at [facility], [address] [place], [place] [address]  Comprehensive metabolic panel     Status: Abnormal   Collection Time: 10/10/23  1:04 PM  Result Value Ref  Range   Sodium 135 135 - 145 mmol/L   Potassium 2.4 (LL) 3.5 - 5.1 mmol/L    Comment: CRITICAL RESULT CALLED TO, READ BACK BY AND VERIFIED WITH Fredrich Birks RN [phone] [facility]   Chloride 93 (L) 98 - 111 mmol/L   CO2 27 22 - 32 mmol/L   Glucose, Bld 131 (H) 70 - 99 mg/dL    Comment: Glucose reference range applies only to samples taken after fasting for at least 8 hours.   BUN 43 (H) 8 - 23 mg/dL   Creatinine, Ser 2.95 (H) 0.44 - 1.00 mg/dL   Calcium 9.0 8.9 - 62.1 mg/dL   Total Protein 7.3 6.5 - 8.1 g/dL   Albumin 3.7 3.5 - 5.0 g/dL   AST 71 (H) 15 - 41 U/L   ALT 37 0 - 44 U/L   Alkaline Phosphatase 59 38 - 126 U/L   Total Bilirubin 1.3 (H) 0.3 - 1.2 mg/dL   GFR, Estimated 55 (L) >60 mL/min    Comment: (NOTE) Calculated using the CKD-EPI Creatinine Equation (2021)    Anion gap 15 5 - 15    Comment: Performed at [facility], [address], [place], [place] [address]  Ammonia     Status: None   Collection Time: 10/10/23  1:04 PM  Result Value Ref Range   Ammonia <10 9 - 35 umol/L    Comment: Performed at [facility], [address], [place], [place] [address]  Ethanol     Status: None   Collection Time: 10/10/23  1:04 PM  Result Value Ref Range   Alcohol, Ethyl (B) <10 <10 mg/dL    Comment: (NOTE) Lowest detectable limit for serum alcohol is 10 mg/dL.  For medical purposes only. Performed at [facility], [address], [place], [place] [address]   CK     Status: Abnormal   Collection Time: 10/10/23  1:04 PM  Result Value Ref Range   Total CK 2,327 (H) 38 - 234 U/L    Comment: Performed at [facility], [address], [place], [place] [address]  Troponin I (High Sensitivity)     Status: Abnormal   Collection Time: 10/10/23  1:04 PM  Result Value Ref Range   Troponin I (High Sensitivity) 184 (HH) <18 ng/L    Comment: CRITICAL RESULT CALLED TO, READ BACK BY AND VERIFIED WITH Fredrich Birks RN [phone] [facility] (NOTE) Elevated high sensitivity troponin I (hsTnI) values and significant  changes across serial measurements may suggest ACS but many other  chronic and acute conditions are known to elevate hsTnI results.  Refer to the "Links" section for chest pain algorithms and additional  guidance. Performed at [facility], [address], [place], [place] [address]   Protime-INR     Status: None   Collection Time: 10/10/23  1:04 PM  Result Value Ref Range   Prothrombin Time 14.5 11.4 - 15.2 seconds   INR 1.1 0.8 - 1.2    Comment: (NOTE) INR goal varies based on device and disease states. Performed at [facility], [address] [place], [place] [address]   APTT     Status: None   Collection Time: 10/10/23  1:04 PM  Result Value Ref Range   aPTT 25 24 - 36 seconds    Comment: Performed at [facility], [address], [place], [place] [address]  Blood Culture (routine x 2)     Status: None (Preliminary result)   Collection Time: 10/10/23  1:04 PM   Specimen: Blood  Result Value Ref Range   Specimen Description BLOOD BLOOD RIGHT FOREARM    Special Requests      BOTTLES DRAWN AEROBIC AND ANAEROBIC Blood Culture adequate volume Performed at [facility], [address], [place], [place] [address]    Culture PENDING    Report Status PENDING   Rapid urine drug screen (hospital performed)     Status: None   Collection Time: 10/10/23  1:11 PM  Result Value Ref Range   Opiates NONE DETECTED NONE DETECTED   Cocaine NONE DETECTED NONE DETECTED   Benzodiazepines NONE DETECTED NONE DETECTED   Amphetamines NONE DETECTED NONE DETECTED   Tetrahydrocannabinol NONE DETECTED NONE DETECTED   Barbiturates NONE DETECTED NONE DETECTED    Comment: (NOTE) DRUG SCREEN FOR MEDICAL PURPOSES ONLY.  IF CONFIRMATION IS NEEDED FOR ANY PURPOSE, NOTIFY LAB WITHIN 5 DAYS.  LOWEST DETECTABLE LIMITS FOR URINE DRUG SCREEN Drug Class                     Cutoff (ng/mL) Amphetamine and metabolites    1000 Barbiturate and metabolites    200 Benzodiazepine                 200 Opiates and  metabolites        300 Cocaine and metabolites        300 THC                            50 Performed at [facility], [address] [place], [place] [address]   Urinalysis, w/ Reflex to Culture (Infection Suspected) -Urine, Clean Catch     Status: Abnormal   Collection Time: 10/10/23  1:11 PM  Result Value Ref Range   Specimen Source URINE, CLEAN CATCH    Color, Urine AMBER (A) YELLOW    Comment: BIOCHEMICALS MAY BE AFFECTED BY COLOR   APPearance CLOUDY (A) CLEAR   Specific Gravity, Urine 1.016 1.005 - 1.030   pH 5.0 5.0 - 8.0   Glucose, UA NEGATIVE NEGATIVE mg/dL   Hgb urine dipstick MODERATE (A) NEGATIVE   Bilirubin Urine NEGATIVE NEGATIVE   Ketones, ur 5 (A) NEGATIVE mg/dL   Protein, ur >=130 (A) NEGATIVE mg/dL   Nitrite NEGATIVE NEGATIVE   Leukocytes,Ua MODERATE (A) NEGATIVE   RBC / HPF 0-5 0 - 5 RBC/hpf   WBC, UA >50 0 - 5 WBC/hpf  Comment:        Reflex urine culture not performed if WBC <=10, OR if Squamous epithelial cells >5. If Squamous epithelial cells >5 suggest recollection.    Bacteria, UA MANY (A) NONE SEEN   Squamous Epithelial / HPF 0-5 0 - 5 /HPF   WBC Clumps PRESENT    Mucus PRESENT    Amorphous Crystal PRESENT    Ca Oxalate Crys, UA PRESENT     Comment: Performed at [facility], [address] [place], [place] [address]  Lactic acid, plasma     Status: None   Collection Time: 10/10/23  2:47 PM  Result Value Ref Range   Lactic Acid, Venous 1.8 0.5 - 1.9 mmol/L    Comment: Performed at [facility], [address] [place], [place] [address]  Magnesium     Status: None   Collection Time: 10/10/23  2:47 PM  Result Value Ref Range   Magnesium 2.1 1.7 - 2.4 mg/dL    Comment: Performed at [facility], [address] [place], [place] [address]  Troponin I (High Sensitivity)     Status: Abnormal   Collection Time: 10/10/23  2:47 PM  Result Value Ref Range   Troponin I (High Sensitivity) 169 (HH) <18 ng/L    Comment: CRITICAL VALUE NOTED.  VALUE IS  CONSISTENT WITH PREVIOUSLY REPORTED AND CALLED VALUE. (NOTE) Elevated high sensitivity troponin I (hsTnI) values and significant  changes across serial measurements may suggest ACS but many other  chronic and acute conditions are known to elevate hsTnI results.  Refer to the "Links" section for chest pain algorithms and additional  guidance. Performed at [facility], [address] [place], [place] [address]   Blood Culture (routine x 2)     Status: None (Preliminary result)   Collection Time: 10/10/23  2:47 PM   Specimen: Left Antecubital; Blood  Result Value Ref Range   Specimen Description LEFT ANTECUBITAL    Special Requests      BOTTLES DRAWN AEROBIC AND ANAEROBIC Blood Culture adequate volume Performed at [facility], [address] [place], [place] [address]    Culture PENDING    Report Status PENDING   Resp panel by RT-PCR (RSV, Flu A&B, Covid) Anterior Nasal Swab     Status: None   Collection Time: 10/10/23  4:18 PM   Specimen: Anterior Nasal Swab  Result Value Ref Range   SARS Coronavirus 2 by RT PCR NEGATIVE NEGATIVE    Comment: (NOTE) SARS-CoV-2 target nucleic acids are NOT DETECTED.  The SARS-CoV-2 RNA is generally detectable in upper respiratory specimens during the acute phase of infection. The lowest concentration of SARS-CoV-2 viral copies this assay can detect is 138 copies/mL. A negative result does not preclude SARS-Cov-2 infection and should not be used as the sole basis for treatment or other patient management decisions. A negative result may occur with  improper specimen collection/handling, submission of specimen other than nasopharyngeal swab, presence of viral mutation(s) within the areas targeted by this assay, and inadequate number of viral copies(<138 copies/mL). A negative result must be combined with clinical observations, patient history, and epidemiological information. The expected result is Negative.  Fact Sheet for Patients:   BloggerCourse.com  Fact Sheet for Healthcare Providers:  SeriousBroker.it  This test is no t yet approved or cleared by the [place] FDA and  has been authorized for detection and/or diagnosis of SARS-CoV-2 by FDA under an Emergency Use Authorization (EUA). This EUA will remain  in effect (meaning this test  can be used) for the duration of the COVID-19 declaration under Section 564(b)(1) of the Act, 21 U.S.C.section 360bbb-3(b)(1), unless the authorization is terminated  or revoked sooner.       Influenza A by PCR NEGATIVE NEGATIVE   Influenza B by PCR NEGATIVE NEGATIVE    Comment: (NOTE) The Xpert Xpress SARS-CoV-2/FLU/RSV plus assay is intended as an aid in the diagnosis of influenza from Nasopharyngeal swab specimens and should not be used as a sole basis for treatment. Nasal washings and aspirates are unacceptable for Xpert Xpress SARS-CoV-2/FLU/RSV testing.  Fact Sheet for Patients: BloggerCourse.com  Fact Sheet for Healthcare Providers: SeriousBroker.it  This test is not yet approved or cleared by the [place] FDA and has been authorized for detection and/or diagnosis of SARS-CoV-2 by FDA under an Emergency Use Authorization (EUA). This EUA will remain in effect (meaning this test can be used) for the duration of the COVID-19 declaration under Section 564(b)(1) of the Act, 21 U.S.C. section 360bbb-3(b)(1), unless the authorization is terminated or revoked.     Resp Syncytial Virus by PCR NEGATIVE NEGATIVE    Comment: (NOTE) Fact Sheet for Patients: BloggerCourse.com  Fact Sheet for Healthcare Providers: SeriousBroker.it  This test is not yet approved or cleared by the [place] FDA and has been authorized for detection and/or diagnosis of SARS-CoV-2 by FDA under an Emergency Use Authorization (EUA).  This EUA will remain in effect (meaning this test can be used) for the duration of the COVID-19 declaration under Section 564(b)(1) of the Act, 21 U.S.C. section 360bbb-3(b)(1), unless the authorization is terminated or revoked.  Performed at [facility], [address], [place], [place] [address]    CT Head Wo Contrast  Result Date: 10/10/2023 CLINICAL DATA:  Found down EXAM: CT HEAD WITHOUT CONTRAST CT CERVICAL SPINE WITHOUT CONTRAST TECHNIQUE: Multidetector CT imaging of the head and cervical spine was performed following the standard protocol without intravenous contrast. Multiplanar CT image reconstructions of the cervical spine were also generated. RADIATION DOSE REDUCTION: This exam was performed according to the departmental dose-optimization program which includes automated exposure control, adjustment of the mA and/or kV according to patient size and/or use of iterative reconstruction technique. COMPARISON:  None Available. FINDINGS: CT HEAD FINDINGS Brain: There is no acute intracranial hemorrhage, extra-axial fluid collection, or acute infarct. Parenchymal volume is normal for age. The ventricles are normal in size. Gray-white differentiation is preserved. Patchy hypodensity in the supratentorial white matter likely reflects sequela of underlying chronic small-vessel ischemic change. There is no mass lesion.  There is no mass effect or midline shift. Vascular: No hyperdense vessel or unexpected calcification. Skull: Normal. Negative for fracture or focal lesion. Sinuses/Orbits: The paranasal sinuses are clear. Bilateral lens implants are in place. The globes and orbits are otherwise unremarkable. Other: The mastoid air cells and middle ear cavities are clear. CT CERVICAL SPINE FINDINGS Alignment: There is exaggerated cervical lordosis. There is no evidence of traumatic malalignment. Skull base and vertebrae: Skull base alignment is maintained. Vertebral body heights are preserved. There is no  evidence of acute fracture. There is no suspicious osseous lesion. Soft tissues and spinal canal: No prevertebral fluid or swelling. No visible canal hematoma. Disc levels: There is mild degenerative endplate change and left facet arthropathy at C3-C4. There is no evidence of high-grade spinal canal or neural foraminal stenosis. Upper chest: The imaged lung apices are clear. Other: None. IMPRESSION: 1. No acute intracranial hemorrhage or calvarial fracture. 2. No acute fracture or traumatic malalignment of the  cervical spine. Electronically Signed   By: Lesia Hausen M.D.   On: 10/10/2023 16:21   CT Cervical Spine Wo Contrast  Result Date: 10/10/2023 CLINICAL DATA:  Found down EXAM: CT HEAD WITHOUT CONTRAST CT CERVICAL SPINE WITHOUT CONTRAST TECHNIQUE: Multidetector CT imaging of the head and cervical spine was performed following the standard protocol without intravenous contrast. Multiplanar CT image reconstructions of the cervical spine were also generated. RADIATION DOSE REDUCTION: This exam was performed according to the departmental dose-optimization program which includes automated exposure control, adjustment of the mA and/or kV according to patient size and/or use of iterative reconstruction technique. COMPARISON:  None Available. FINDINGS: CT HEAD FINDINGS Brain: There is no acute intracranial hemorrhage, extra-axial fluid collection, or acute infarct. Parenchymal volume is normal for age. The ventricles are normal in size. Gray-white differentiation is preserved. Patchy hypodensity in the supratentorial white matter likely reflects sequela of underlying chronic small-vessel ischemic change. There is no mass lesion.  There is no mass effect or midline shift. Vascular: No hyperdense vessel or unexpected calcification. Skull: Normal. Negative for fracture or focal lesion. Sinuses/Orbits: The paranasal sinuses are clear. Bilateral lens implants are in place. The globes and orbits are otherwise  unremarkable. Other: The mastoid air cells and middle ear cavities are clear. CT CERVICAL SPINE FINDINGS Alignment: There is exaggerated cervical lordosis. There is no evidence of traumatic malalignment. Skull base and vertebrae: Skull base alignment is maintained. Vertebral body heights are preserved. There is no evidence of acute fracture. There is no suspicious osseous lesion. Soft tissues and spinal canal: No prevertebral fluid or swelling. No visible canal hematoma. Disc levels: There is mild degenerative endplate change and left facet arthropathy at C3-C4. There is no evidence of high-grade spinal canal or neural foraminal stenosis. Upper chest: The imaged lung apices are clear. Other: None. IMPRESSION: 1. No acute intracranial hemorrhage or calvarial fracture. 2. No acute fracture or traumatic malalignment of the cervical spine. Electronically Signed   By: Lesia Hausen M.D.   On: 10/10/2023 16:21   DG Foot Complete Left  Result Date: 10/10/2023 CLINICAL DATA:  Fall, pain. EXAM: LEFT FOOT - COMPLETE 3+ VIEW COMPARISON:  None Available. FINDINGS: There is no evidence of fracture or dislocation. There is no evidence of arthropathy or other focal bone abnormality. Surgical hardware in the distal tibia and fibula, partially included. There is a plantar calcaneal spur. Soft tissues are unremarkable. IMPRESSION: No fracture or dislocation of the left foot. Electronically Signed   By: Narda Rutherford M.D.   On: 10/10/2023 15:41   DG Foot Complete Right  Result Date: 10/10/2023 CLINICAL DATA:  Fall, pain. EXAM: RIGHT FOOT COMPLETE - 3+ VIEW COMPARISON:  None Available. FINDINGS: There is no evidence of fracture or dislocation. Diminutive plantar calcaneal spur. There is no evidence of arthropathy or other focal bone abnormality. Soft tissues are unremarkable. IMPRESSION: No fracture or subluxation of the right foot. Electronically Signed   By: Narda Rutherford M.D.   On: 10/10/2023 15:40   DG Knee  Complete 4 Views Right  Result Date: 10/10/2023 CLINICAL DATA:  Fall, pain. EXAM: RIGHT KNEE - COMPLETE 4+ VIEW COMPARISON:  None Available. FINDINGS: No evidence of fracture, dislocation, or joint effusion. The joint spaces are preserved. There are vascular calcifications. Mild anterior soft tissue edema. IMPRESSION: Mild anterior soft tissue edema. No fracture or subluxation. Electronically Signed   By: Narda Rutherford M.D.   On: 10/10/2023 15:40   DG Chest Portable 1 View  Result Date: 10/10/2023  CLINICAL DATA:  Fall, pain. EXAM: PORTABLE CHEST 1 VIEW COMPARISON:  09/10/2023 FINDINGS: The cardiomediastinal contours are stable, unchanged aortic tortuosity. The lungs are clear. Pulmonary vasculature is normal. No consolidation, pleural effusion, or pneumothorax. No acute osseous abnormalities are seen. IMPRESSION: No acute chest findings. Electronically Signed   By: Narda Rutherford M.D.   On: 10/10/2023 15:39   DG Hips Bilat W or Wo Pelvis 3-4 Views  Result Date: 10/10/2023 CLINICAL DATA:  Fall with hip pain. EXAM: DG HIP (WITH OR WITHOUT PELVIS) 3-4V BILAT COMPARISON:  Right hip radiograph 09/07/2023 FINDINGS: No acute fracture of the pelvis or hips. Chronic right hip arthropathy with deformity of the femoral head and abnormal appearance of the hip joint. This is stable from radiographs last month. The pubic rami are intact. No pubic symphyseal or sacroiliac diastasis. IMPRESSION: 1. No acute fracture of the pelvis or hips. 2. Chronic right hip arthropathy. Electronically Signed   By: Narda Rutherford M.D.   On: 10/10/2023 15:38   DG Shoulder Left  Result Date: 10/10/2023 CLINICAL DATA:  Fall today with pain. EXAM: LEFT SHOULDER - 2+ VIEW COMPARISON:  None Available. FINDINGS: There is no evidence of fracture or dislocation. Joint spaces are preserved. There is no evidence of arthropathy or other focal bone abnormality. Soft tissues are unremarkable. IMPRESSION: No fracture or dislocation of  the left shoulder. Electronically Signed   By: Narda Rutherford M.D.   On: 10/10/2023 15:37    Pending Labs Unresulted Labs (From admission, onward)     Start     Ordered   10/10/23 1311  Urine Culture  Once,   R        10/10/23 1311            Vitals/Pain Today's Vitals   10/10/23 1845 10/10/23 1915 10/10/23 1945 10/10/23 1949  BP: 136/73 131/69 130/74   Pulse: 95 88 94   Resp: (!) 23 15 17    Temp:    (!) 97.5 F (36.4 C)  TempSrc:    Oral  SpO2: 98% 95% 95%   Weight:      Height:      PainSc:        Isolation Precautions No active isolations  Medications Medications  cefTRIAXone (ROCEPHIN) 2 g in sodium chloride 0.9 % 100 mL IVPB (0 g Intravenous Stopped 10/10/23 1531)  0.9 %  sodium chloride infusion ( Intravenous New Bag/Given 10/10/23 1851)  sodium chloride 0.9 % bolus 1,000 mL (0 mLs Intravenous Stopped 10/10/23 1544)  potassium chloride 10 mEq in 100 mL IVPB (0 mEq Intravenous Stopped 10/10/23 1834)  magnesium sulfate IVPB 2 g 50 mL (0 g Intravenous Stopped 10/10/23 1531)  Tdap (BOOSTRIX) injection 0.5 mL (0.5 mLs Intramuscular Given 10/10/23 1505)    Mobility walks     Focused Assessments    R Recommendations: See Admitting Provider Note  Report given to:   Additional Notes:   At baseline pt usually walks independently, but pt unable to ambulate at this time. Pt has purewick.

## 2023-10-10 NOTE — Sepsis Progress Note (Signed)
Elink monitoring for the code sepsis protocol.

## 2023-10-11 ENCOUNTER — Encounter (HOSPITAL_COMMUNITY): Payer: Self-pay | Admitting: Internal Medicine

## 2023-10-11 ENCOUNTER — Inpatient Hospital Stay (HOSPITAL_COMMUNITY): Payer: Medicare Other

## 2023-10-11 ENCOUNTER — Ambulatory Visit (HOSPITAL_COMMUNITY): Payer: Medicare Other | Admitting: Physical Therapy

## 2023-10-11 DIAGNOSIS — E876 Hypokalemia: Secondary | ICD-10-CM | POA: Diagnosis not present

## 2023-10-11 DIAGNOSIS — A419 Sepsis, unspecified organism: Secondary | ICD-10-CM | POA: Diagnosis not present

## 2023-10-11 DIAGNOSIS — W19XXXA Unspecified fall, initial encounter: Secondary | ICD-10-CM | POA: Diagnosis not present

## 2023-10-11 DIAGNOSIS — I2489 Other forms of acute ischemic heart disease: Secondary | ICD-10-CM

## 2023-10-11 DIAGNOSIS — R7989 Other specified abnormal findings of blood chemistry: Secondary | ICD-10-CM

## 2023-10-11 DIAGNOSIS — T796XXA Traumatic ischemia of muscle, initial encounter: Secondary | ICD-10-CM | POA: Diagnosis not present

## 2023-10-11 LAB — BASIC METABOLIC PANEL
Anion gap: 11 (ref 5–15)
BUN: 49 mg/dL — ABNORMAL HIGH (ref 8–23)
CO2: 23 mmol/L (ref 22–32)
Calcium: 7.9 mg/dL — ABNORMAL LOW (ref 8.9–10.3)
Chloride: 101 mmol/L (ref 98–111)
Creatinine, Ser: 0.92 mg/dL (ref 0.44–1.00)
GFR, Estimated: >60 mL/min (ref >60)
Glucose, Bld: 120 mg/dL — ABNORMAL HIGH (ref 70–99)
Potassium: 3.4 mmol/L — ABNORMAL LOW (ref 3.5–5.1)
Sodium: 135 mmol/L (ref 135–145)

## 2023-10-11 LAB — CBC
HCT: 40.7 % (ref 36.0–46.0)
Hemoglobin: 14.1 g/dL (ref 12.0–15.0)
MCH: 30.8 pg (ref 26.0–34.0)
MCHC: 34.6 g/dL (ref 30.0–36.0)
MCV: 88.9 fL (ref 80.0–100.0)
Platelets: 315 10*3/uL (ref 150–400)
RBC: 4.58 MIL/uL (ref 3.87–5.11)
RDW: 14.4 % (ref 11.5–15.5)
WBC: 8.3 10*3/uL (ref 4.0–10.5)
nRBC: 0 % (ref 0.0–0.2)

## 2023-10-11 LAB — ECHOCARDIOGRAM COMPLETE
AR max vel: 0.91 cm2
AV Area VTI: 1.03 cm2
AV Area mean vel: 0.77 cm2
AV Mean grad: 20.8 mm[Hg]
AV Peak grad: 34.5 mm[Hg]
Ao pk vel: 2.94 m/s
Area-P 1/2: 3.37 cm2
Height: 61 in
MV VTI: 3 cm2
S' Lateral: 2.8 cm
Weight: 2190.49 [oz_av]

## 2023-10-11 LAB — TSH: TSH: 3.452 u[IU]/mL (ref 0.350–4.500)

## 2023-10-11 LAB — CK: Total CK: 1640 U/L — ABNORMAL HIGH (ref 38–234)

## 2023-10-11 LAB — T4, FREE: Free T4: 1.47 ng/dL — ABNORMAL HIGH (ref 0.61–1.12)

## 2023-10-11 MED ORDER — ENOXAPARIN SODIUM 40 MG/0.4ML IJ SOSY
40.0000 mg | PREFILLED_SYRINGE | INTRAMUSCULAR | Status: DC
  Administered 2023-10-11 – 2023-10-14 (×4): 40 mg via SUBCUTANEOUS
  Filled 2023-10-11 (×4): qty 0.4

## 2023-10-11 MED ORDER — ONDANSETRON HCL 4 MG PO TABS: 4.0000 mg | ORAL_TABLET | Freq: Four times a day (QID) | ORAL | Status: DC | PRN

## 2023-10-11 MED ORDER — POLYETHYLENE GLYCOL 3350 17 G PO PACK: 17.0000 g | PACK | Freq: Every day | ORAL | Status: DC | PRN

## 2023-10-11 MED ORDER — ONDANSETRON HCL 4 MG/2ML IJ SOLN: 4.0000 mg | Freq: Four times a day (QID) | INTRAMUSCULAR | Status: DC | PRN

## 2023-10-11 MED ORDER — SODIUM CHLORIDE 0.9 % IV SOLN
1.0000 g | INTRAVENOUS | Status: DC
  Administered 2023-10-11: 1 g via INTRAVENOUS
  Filled 2023-10-11: qty 10

## 2023-10-11 MED ORDER — POTASSIUM CHLORIDE CRYS ER 20 MEQ PO TBCR
60.0000 meq | EXTENDED_RELEASE_TABLET | Freq: Once | ORAL | Status: AC
  Administered 2023-10-11: 60 meq via ORAL
  Filled 2023-10-11: qty 3

## 2023-10-11 MED ORDER — ACETAMINOPHEN 325 MG PO TABS
650.0000 mg | ORAL_TABLET | Freq: Four times a day (QID) | ORAL | Status: DC | PRN
  Administered 2023-10-13: 650 mg via ORAL
  Filled 2023-10-11: qty 2

## 2023-10-11 MED ORDER — ACETAMINOPHEN 650 MG RE SUPP: 650.0000 mg | Freq: Four times a day (QID) | RECTAL | Status: DC | PRN

## 2023-10-11 MED ORDER — SODIUM CHLORIDE 0.9 % IV SOLN: INTRAVENOUS | Status: AC

## 2023-10-11 NOTE — TOC Initial Note (Signed)
Transition of Care [facility]) - Initial/Assessment Note    Patient Details  Name: Barbara Eaton MRN: [id number] Date of Birth: 04-12-1951  Transition of Care [facility]) CM/SW Contact:    [name], LCSW Phone Number: 10/11/2023, 2:03 PM  Clinical Narrative:                  Pt admitted from home. She was found down upon a welfare check from Patent examiner. MD anticipating pt will need SNF rehab at dc. PT eval pending.  Reviewed pt's record and awaiting PT. Will follow up once PT recommendations in place to assist with dc planning.  Expected Discharge Plan: Skilled Nursing Facility Barriers to Discharge: Continued Medical Work up   Patient Goals and CMS Choice Patient states their goals for this hospitalization and ongoing recovery are:: get better          Expected Discharge Plan and Services In-house Referral: Clinical Social Work     Living arrangements for the past 2 months: Single Family Home                                      Prior Living Arrangements/Services Living arrangements for the past 2 months: Single Family Home Lives with:: Self Patient language and need for interpreter reviewed:: Yes Do you feel safe going back to the place where you live?: Yes      Need for Family Participation in Patient Care: No (Comment) Care giver support system in place?: No (comment)   Criminal Activity/Legal Involvement Pertinent to Current Situation/Hospitalization: No - Comment as needed  Activities of Daily Living   ADL Screening (condition at time of admission) Independently performs ADLs?: Yes (appropriate for developmental age) Is the patient deaf or have difficulty hearing?: No Does the patient have difficulty seeing, even when wearing glasses/contacts?: No Does the patient have difficulty concentrating, remembering, or making decisions?: No  Permission Sought/Granted                  Emotional Assessment       Orientation: : Oriented to  Self, Oriented to Place, Oriented to  Time, Oriented to Situation Alcohol / Substance Use: Not Applicable Psych Involvement: No (comment)  Admission diagnosis:  Acute cystitis without hematuria [N30.00] Sepsis (HCC) [A41.9] Traumatic rhabdomyolysis, initial encounter (HCC) [T79.6XXA] Sepsis, due to unspecified organism, unspecified whether acute organ dysfunction present [facility]) [A41.9] Patient Active Problem List   Diagnosis Date Noted   Sepsis secondary to UTI (HCC) 10/10/2023   Fall at home, initial encounter 10/10/2023   Hypokalemia 10/10/2023   Rhabdomyolysis 10/10/2023   Elevated troponin 10/10/2023   PCP:  Pcp, No Pharmacy:   [id number] - [place], [place] - [address] [place] [address] Phone: [phone] Fax: [phone]  [facility] [address], [place] - [place] [address] [place] [address] Phone: [phone] Fax: [phone]     Social Determinants of Health (SDOH) Social History: SDOH Screenings   Food Insecurity: No Food Insecurity (10/10/2023)  Housing: Low Risk  (10/10/2023)  Transportation Needs: No Transportation Needs (10/10/2023)  Utilities: Not At Risk (10/10/2023)  Financial Resource Strain: Low Risk  (06/21/2023)   Received from [facility]  Social Connections: Unknown (10/04/2023)   Received from [facility]  Tobacco Use: Low Risk  (10/11/2023)  Health Literacy: Low Risk  (06/21/2023)   Received from [facility]  Care   SDOH Interventions:     Readmission Risk Interventions     No data to display

## 2023-10-11 NOTE — Progress Notes (Signed)
Was able to reach niece that is next of kin to patient. Will put niece on contact list with patients permission. [name] [phone] [name])

## 2023-10-11 NOTE — Plan of Care (Signed)

## 2023-10-11 NOTE — Progress Notes (Signed)
*  PRELIMINARY RESULTS* Echocardiogram 2D Echocardiogram has been performed.  Barbara Eaton 10/11/2023, 2:26 PM

## 2023-10-11 NOTE — Progress Notes (Signed)
PROGRESS NOTE  Barbara Eaton:[id number] DOB: 22-Jun-1951   PCP: Pcp, No  Patient is from: Home.  DOA: 10/10/2023 LOS: 1  Chief complaints Chief Complaint  Patient presents with   Fall     Brief Narrative / Interim history: 72 year old F with PMH of hyperlipidemia brought to ED by EMS after law enforcement had to make efforts at injury into patient's home for a welfare check and found patient down on the floor covered in urine and feces.  She was down for unknown period of time.  She was admitted for sepsis secondary to UTI, rhabdomyolysis, hypokalemia and elevated troponin.  Cultures obtained.  Started on IV ceftriaxone and IV fluid.  Echocardiogram ordered as well.  Subjective: Seen and examined earlier this morning.  No major events overnight of this morning.  Complaining of pain in her legs likely from fall.  She has bruising and some erythema in both legs.   Objective: Vitals:   10/10/23 1949 10/10/23 2100 10/11/23 0100 10/11/23 0347  BP:  138/64 129/63 131/60  Pulse:  99 94 95  Resp:  18    Temp: (!) 97.5 F (36.4 C) 97.9 F (36.6 C) 97.6 F (36.4 C) 98.1 F (36.7 C)  TempSrc: Oral Oral Oral Oral  SpO2:  97% 98% 97%  Weight:  62.1 kg    Height:  5\' 1"  (1.549 m)      Examination:  GENERAL: No apparent distress.  Nontoxic. HEENT: MMM.  Vision and hearing grossly intact.  NECK: Supple.  No apparent JVD.  RESP:  No IWOB.  Fair aeration bilaterally. CVS:  RRR.  3/6 SEM globally. ABD/GI/GU: BS+. Abd soft, NTND.  MSK/EXT:  Moves extremities but weak in both legs.  Some erythema and skin bruises in both legs. SKIN: As above NEURO: Awake, alert and oriented x 4 except date.  No apparent focal neuro deficit but BLE weakness. PSYCH: Calm. Normal affect.   Procedures:  None  Microbiology summarized: Blood cultures NGTD Urine culture with E. Coli COVID-19, influenza and RSV PCR nonreactive  Assessment and plan: Principal Problem:   Sepsis secondary to UTI  [facility]) Active Problems:   Fall at home, initial encounter   Hypokalemia   Elevated troponin   Rhabdomyolysis  Sepsis due to E. coli UTI: POA.  Patient reported some dysuria on presentation although she denies this now.  UA consistent with UTI.  Urine culture with E. coli.  Blood cultures NGTD. -Continue IV ceftriaxone pending culture sensitivity   Elevated troponin: Patient without cardiopulmonary symptoms.  No significant delta to suggest ACS.  EKG without acute ischemic finding.  Suspect this is demand ischemia in the setting of sepsis, dehydration and rhabdomyolysis.  Echocardiogram reassuring. -No further workup indicated.   Profound hypokalemia: Improved. -Monitor replenish as appropriate   Fall at home, initial encounter: Unknown mechanism of fall.  Found down on the floor covered in feces and urine by law enforcement.  She is very weak -Fall precaution -PT/OT   Traumatic rhabdomyolysis: Likely due to fall.  CK elevated to 2327.  Improved with IV fluid. -Continue IV fluid hydration -Continue holding statin -Continue monitoring  Proteinuria: Urine protein > 300. -Check UPC  Hyperlipidemia:  -Hold statin. Body mass index is 25.87 kg/m.           DVT prophylaxis:  enoxaparin (LOVENOX) injection 40 mg Start: 10/11/23 1000  Code Status: Full code Family Communication: None at bedside Level of care: Telemetry Status is: Inpatient Remains inpatient appropriate because: Sepsis due to  UTI, rhabdomyolysis and hypokalemia   Final disposition: SNF Consultants:  None  55 minutes with more than 50% spent in reviewing records, counseling patient/family and coordinating care.   Sch Meds:  Scheduled Meds:  enoxaparin (LOVENOX) injection  40 mg Subcutaneous Q24H   Continuous Infusions:  sodium chloride 100 mL/hr at 10/11/23 1415   cefTRIAXone (ROCEPHIN)  IV     PRN Meds:.acetaminophen **OR** acetaminophen, diclofenac Sodium, HYDROcodone-acetaminophen, ondansetron  **OR** ondansetron (ZOFRAN) IV, polyethylene glycol  Antimicrobials: Anti-infectives (From admission, onward)    Start     Dose/Rate Route Frequency Ordered Stop   10/11/23 1445  cefTRIAXone (ROCEPHIN) 1 g in sodium chloride 0.9 % 100 mL IVPB        1 g 200 mL/hr over 30 Minutes Intravenous Every 24 hours 10/11/23 0517 10/17/23 1444   10/10/23 1445  cefTRIAXone (ROCEPHIN) 2 g in sodium chloride 0.9 % 100 mL IVPB  Status:  Discontinued        2 g 200 mL/hr over 30 Minutes Intravenous Every 24 hours 10/10/23 1435 10/11/23 0517        I have personally reviewed the following labs and images: CBC: Recent Labs  Lab 10/10/23 1304 10/11/23 0453  WBC 13.8* 8.3  NEUTROABS 11.9*  --   HGB 17.9* 14.1  HCT 51.5* 40.7  MCV 87.6 88.9  PLT 396 315   BMP &GFR Recent Labs  Lab 10/10/23 1304 10/10/23 1447 10/11/23 0453  NA 135  --  135  K 2.4*  --  3.4*  CL 93*  --  101  CO2 27  --  23  GLUCOSE 131*  --  120*  BUN 43*  --  49*  CREATININE 1.08*  --  0.92  CALCIUM 9.0  --  7.9*  MG  --  2.1  --    Estimated Creatinine Clearance: 46.7 mL/min (by C-G formula based on SCr of 0.92 mg/dL). Liver & Pancreas: Recent Labs  Lab 10/10/23 1304  AST 71*  ALT 37  ALKPHOS 59  BILITOT 1.3*  PROT 7.3  ALBUMIN 3.7   No results for input(s): "LIPASE", "AMYLASE" in the last 168 hours. Recent Labs  Lab 10/10/23 1304  AMMONIA <10   Diabetic: No results for input(s): "HGBA1C" in the last 72 hours. No results for input(s): "GLUCAP" in the last 168 hours. Cardiac Enzymes: Recent Labs  Lab 10/10/23 1304 10/11/23 0453  CKTOTAL 2,327* 1,640*   No results for input(s): "PROBNP" in the last 8760 hours. Coagulation Profile: Recent Labs  Lab 10/10/23 1304  INR 1.1   Thyroid Function Tests: Recent Labs    10/11/23 0453  TSH 3.452  FREET4 1.47*   Lipid Profile: No results for input(s): "CHOL", "HDL", "LDLCALC", "TRIG", "CHOLHDL", "LDLDIRECT" in the last 72 hours. Anemia  Panel: No results for input(s): "VITAMINB12", "FOLATE", "FERRITIN", "TIBC", "IRON", "RETICCTPCT" in the last 72 hours. Urine analysis:    Component Value Date/Time   COLORURINE AMBER (A) 10/10/2023 1311   APPEARANCEUR CLOUDY (A) 10/10/2023 1311   LABSPEC 1.016 10/10/2023 1311   PHURINE 5.0 10/10/2023 1311   GLUCOSEU NEGATIVE 10/10/2023 1311   HGBUR MODERATE (A) 10/10/2023 1311   BILIRUBINUR NEGATIVE 10/10/2023 1311   KETONESUR 5 (A) 10/10/2023 1311   PROTEINUR >=300 (A) 10/10/2023 1311   NITRITE NEGATIVE 10/10/2023 1311   LEUKOCYTESUR MODERATE (A) 10/10/2023 1311   Sepsis Labs: Invalid input(s): "PROCALCITONIN", "LACTICIDVEN"  Microbiology: Recent Results (from the past 240 hour(s))  Blood Culture (routine x 2)     Status:  None (Preliminary result)   Collection Time: 10/10/23  1:04 PM   Specimen: BLOOD  Result Value Ref Range Status   Specimen Description BLOOD BLOOD RIGHT FOREARM  Final   Special Requests   Final    BOTTLES DRAWN AEROBIC AND ANAEROBIC Blood Culture adequate volume   Culture   Final    NO GROWTH < 24 HOURS Performed at [facility], [address], [place], [place] [address]    Report Status PENDING  Incomplete  Urine Culture     Status: Abnormal (Preliminary result)   Collection Time: 10/10/23  1:11 PM   Specimen: Urine, Random  Result Value Ref Range Status   Specimen Description   Final    URINE, RANDOM Performed at [facility], [address], [place], [place] [address]    Special Requests   Final    NONE Reflexed from [id number] Performed at [facility], [address], [place], [place] [address]    Culture >=100,000 COLONIES/mL ESCHERICHIA COLI (A)  Final   Report Status PENDING  Incomplete  Blood Culture (routine x 2)     Status: None (Preliminary result)   Collection Time: 10/10/23  2:47 PM   Specimen: Left Antecubital; Blood  Result Value Ref Range Status   Specimen Description LEFT ANTECUBITAL  Final   Special Requests   Final    BOTTLES  DRAWN AEROBIC AND ANAEROBIC Blood Culture adequate volume   Culture   Final    NO GROWTH < 24 HOURS Performed at [facility], [address], [place], [place] [address]    Report Status PENDING  Incomplete  Resp panel by RT-PCR (RSV, Flu A&B, Covid) Anterior Nasal Swab     Status: None   Collection Time: 10/10/23  4:18 PM   Specimen: Anterior Nasal Swab  Result Value Ref Range Status   SARS Coronavirus 2 by RT PCR NEGATIVE NEGATIVE Final    Comment: (NOTE) SARS-CoV-2 target nucleic acids are NOT DETECTED.  The SARS-CoV-2 RNA is generally detectable in upper respiratory specimens during the acute phase of infection. The lowest concentration of SARS-CoV-2 viral copies this assay can detect is 138 copies/mL. A negative result does not preclude SARS-Cov-2 infection and should not be used as the sole basis for treatment or other patient management decisions. A negative result may occur with  improper specimen collection/handling, submission of specimen other than nasopharyngeal swab, presence of viral mutation(s) within the areas targeted by this assay, and inadequate number of viral copies(<138 copies/mL). A negative result must be combined with clinical observations, patient history, and epidemiological information. The expected result is Negative.  Fact Sheet for Patients:  BloggerCourse.com  Fact Sheet for Healthcare Providers:  SeriousBroker.it  This test is no t yet approved or cleared by the [place] FDA and  has been authorized for detection and/or diagnosis of SARS-CoV-2 by FDA under an Emergency Use Authorization (EUA). This EUA will remain  in effect (meaning this test can be used) for the duration of the COVID-19 declaration under Section 564(b)(1) of the Act, 21 U.S.C.section 360bbb-3(b)(1), unless the authorization is terminated  or revoked sooner.       Influenza A by PCR NEGATIVE NEGATIVE Final   Influenza B  by PCR NEGATIVE NEGATIVE Final    Comment: (NOTE) The Xpert Xpress SARS-CoV-2/FLU/RSV plus assay is intended as an aid in the diagnosis of influenza from Nasopharyngeal swab specimens and should not be used as a sole basis for treatment. Nasal washings and aspirates are unacceptable for Xpert Xpress  SARS-CoV-2/FLU/RSV testing.  Fact Sheet for Patients: BloggerCourse.com  Fact Sheet for Healthcare Providers: SeriousBroker.it  This test is not yet approved or cleared by the [place] FDA and has been authorized for detection and/or diagnosis of SARS-CoV-2 by FDA under an Emergency Use Authorization (EUA). This EUA will remain in effect (meaning this test can be used) for the duration of the COVID-19 declaration under Section 564(b)(1) of the Act, 21 U.S.C. section 360bbb-3(b)(1), unless the authorization is terminated or revoked.     Resp Syncytial Virus by PCR NEGATIVE NEGATIVE Final    Comment: (NOTE) Fact Sheet for Patients: BloggerCourse.com  Fact Sheet for Healthcare Providers: SeriousBroker.it  This test is not yet approved or cleared by the [place] FDA and has been authorized for detection and/or diagnosis of SARS-CoV-2 by FDA under an Emergency Use Authorization (EUA). This EUA will remain in effect (meaning this test can be used) for the duration of the COVID-19 declaration under Section 564(b)(1) of the Act, 21 U.S.C. section 360bbb-3(b)(1), unless the authorization is terminated or revoked.  Performed at [facility], [address], [place], [place] [address]     Radiology Studies: ECHOCARDIOGRAM COMPLETE  Result Date: 10/11/2023    ECHOCARDIOGRAM REPORT   Patient Name:   Barbara Eaton Date of Exam: 10/11/2023 Medical Rec #:  [id number]         Height:       61.0 in Accession #:    [id number]        Weight:       136.9 lb Date of Birth:  [birth year]          BSA:          1.608 m Patient Age:    71 years          BP:           131/60 mmHg Patient Gender: F                 HR:           94 bpm. [facility]am [facility] Procedure: 2D Echo, Cardiac Doppler and Color Doppler Indications:    Elevated Troponin  History:        Patient has no prior history of Echocardiogram examinations.  Sonographer:    Mikki Harbor Referring Phys: [phone] [facility]  Sonographer Comments: Technically difficult study due to poor echo windows. Image acquisition challenging due to breast implants. IMPRESSIONS  1. Left ventricular ejection fraction, by estimation, is 60 to 65%. The left ventricle has normal function. The left ventricle has no regional wall motion abnormalities. There is mild left ventricular hypertrophy. Left ventricular diastolic parameters are consistent with Grade I diastolic dysfunction (impaired relaxation).  2. Right ventricular systolic function is normal. The right ventricular size is normal. There is normal pulmonary artery systolic pressure.  3. Left atrial size was mildly dilated.  4. The mitral valve is normal in structure. No evidence of mitral valve regurgitation. No evidence of mitral stenosis.  5. Highest aortic gradient is with Pedhoff probe, mean gradient 31 mmHg. . The aortic valve was not well visualized. Aortic valve regurgitation is not visualized. Moderate aortic valve stenosis. Aortic valve peak gradient measures 34.5 mmHg. Aortic valve area, by VTI measures 1.03 cm.  6. The inferior vena cava is normal in size with greater than 50% respiratory variability, suggesting right atrial pressure of 3 mmHg. FINDINGS  Left Ventricle: Left ventricular ejection fraction, by estimation, is 60 to 65%. The  left ventricle has normal function. The left ventricle has no regional wall motion abnormalities. The left ventricular internal cavity size was normal in size. There is  mild left ventricular hypertrophy. Left ventricular diastolic parameters are  consistent with Grade I diastolic dysfunction (impaired relaxation). Normal left ventricular filling pressure. Right Ventricle: The right ventricular size is normal. Right vetricular wall thickness was not well visualized. Right ventricular systolic function is normal. There is normal pulmonary artery systolic pressure. The tricuspid regurgitant velocity is 2.67 m/s, and with an assumed right atrial pressure of 3 mmHg, the estimated right ventricular systolic pressure is 31.5 mmHg. Left Atrium: Left atrial size was mildly dilated. Right Atrium: Right atrial size was normal in size. Pericardium: There is no evidence of pericardial effusion. Mitral Valve: The mitral valve is normal in structure. No evidence of mitral valve regurgitation. No evidence of mitral valve stenosis. MV peak gradient, 5.9 mmHg. The mean mitral valve gradient is 2.0 mmHg. Tricuspid Valve: The tricuspid valve is normal in structure. Tricuspid valve regurgitation is not demonstrated. No evidence of tricuspid stenosis. Aortic Valve: Highest aortic gradient is with Pedhoff probe, mean gradient 31 mmHg. The aortic valve was not well visualized. Aortic valve regurgitation is not visualized. Moderate aortic stenosis is present. Aortic valve mean gradient measures 20.8 mmHg. Aortic valve peak gradient measures 34.5 mmHg. Aortic valve area, by VTI measures 1.03 cm. Pulmonic Valve: The pulmonic valve was not well visualized. Pulmonic valve regurgitation is not visualized. No evidence of pulmonic stenosis. Aorta: The aortic root is normal in size and structure. Venous: The inferior vena cava is normal in size with greater than 50% respiratory variability, suggesting right atrial pressure of 3 mmHg. IAS/Shunts: No atrial level shunt detected by color flow Doppler.  LEFT VENTRICLE PLAX 2D LVIDd:         4.30 cm   Diastology LVIDs:         2.80 cm   LV e' medial:    6.64 cm/s LV PW:         1.10 cm   LV E/e' medial:  11.0 LV IVS:        1.20 cm   LV e'  lateral:   8.05 cm/s LVOT diam:     1.90 cm   LV E/e' lateral: 9.0 LV SV:         65 LV SV Index:   40 LVOT Area:     2.84 cm  RIGHT VENTRICLE RV Basal diam:  3.15 cm LEFT ATRIUM             Index        RIGHT ATRIUM           Index LA diam:        4.50 cm 2.80 cm/m   RA Area:     14.00 cm LA Vol (A2C):   47.7 ml 29.67 ml/m  RA Volume:   30.10 ml  18.72 ml/m LA Vol (A4C):   80.8 ml 50.26 ml/m LA Biplane Vol: 66.4 ml 41.30 ml/m  AORTIC VALVE                     PULMONIC VALVE AV Area (Vmax):    0.91 cm      PV Vmax:       1.01 m/s AV Area (Vmean):   0.77 cm      PV Peak grad:  4.1 mmHg AV Area (VTI):     1.03 cm AV Vmax:  293.50 cm/s AV Vmean:          215.250 cm/s AV VTI:            0.628 m AV Peak Grad:      34.5 mmHg AV Mean Grad:      20.8 mmHg LVOT Vmax:         94.60 cm/s LVOT Vmean:        58.300 cm/s LVOT VTI:          0.228 m LVOT/AV VTI ratio: 0.36  AORTA Ao Root diam: 3.30 cm MITRAL VALVE                TRICUSPID VALVE MV Area (PHT): 3.37 cm     TR Peak grad:   28.5 mmHg MV Area VTI:   3.00 cm     TR Vmax:        267.00 cm/s MV Peak grad:  5.9 mmHg MV Mean grad:  2.0 mmHg     SHUNTS MV Vmax:       1.21 m/s     Systemic VTI:  0.23 m MV Vmean:      67.6 cm/s    Systemic Diam: 1.90 cm MV Decel Time: 225 msec MV E velocity: 72.80 cm/s MV A velocity: 116.00 cm/s MV E/A ratio:  0.63 Dina Rich MD Electronically signed by Dina Rich MD Signature Date/Time: 10/11/2023/2:43:36 PM    Final       Amritha Yorke T. Stanly [facility]  If 7PM-7AM, please contact night-coverage [email or web address] 10/11/2023, 4:12 PM

## 2023-10-12 DIAGNOSIS — A419 Sepsis, unspecified organism: Secondary | ICD-10-CM | POA: Diagnosis not present

## 2023-10-12 DIAGNOSIS — R7989 Other specified abnormal findings of blood chemistry: Secondary | ICD-10-CM | POA: Diagnosis not present

## 2023-10-12 DIAGNOSIS — T796XXD Traumatic ischemia of muscle, subsequent encounter: Secondary | ICD-10-CM

## 2023-10-12 DIAGNOSIS — W19XXXA Unspecified fall, initial encounter: Secondary | ICD-10-CM | POA: Diagnosis not present

## 2023-10-12 DIAGNOSIS — E876 Hypokalemia: Secondary | ICD-10-CM | POA: Diagnosis not present

## 2023-10-12 LAB — COMPREHENSIVE METABOLIC PANEL
ALT: 34 U/L (ref 0–44)
AST: 51 U/L — ABNORMAL HIGH (ref 15–41)
Albumin: 2.5 g/dL — ABNORMAL LOW (ref 3.5–5.0)
Alkaline Phosphatase: 38 U/L (ref 38–126)
Anion gap: 5 (ref 5–15)
BUN: 31 mg/dL — ABNORMAL HIGH (ref 8–23)
CO2: 24 mmol/L (ref 22–32)
Calcium: 7.8 mg/dL — ABNORMAL LOW (ref 8.9–10.3)
Chloride: 106 mmol/L (ref 98–111)
Creatinine, Ser: 0.61 mg/dL (ref 0.44–1.00)
GFR, Estimated: >60 mL/min (ref >60)
Glucose, Bld: 100 mg/dL — ABNORMAL HIGH (ref 70–99)
Potassium: 4.3 mmol/L (ref 3.5–5.1)
Sodium: 135 mmol/L (ref 135–145)
Total Bilirubin: 0.7 mg/dL (ref 0.3–1.2)
Total Protein: 5 g/dL — ABNORMAL LOW (ref 6.5–8.1)

## 2023-10-12 LAB — URINE CULTURE: Culture: >=100000 — AB

## 2023-10-12 LAB — CBC
HCT: 35.4 % — ABNORMAL LOW (ref 36.0–46.0)
Hemoglobin: 11.8 g/dL — ABNORMAL LOW (ref 12.0–15.0)
MCH: 30.6 pg (ref 26.0–34.0)
MCHC: 33.3 g/dL (ref 30.0–36.0)
MCV: 91.9 fL (ref 80.0–100.0)
Platelets: 244 10*3/uL (ref 150–400)
RBC: 3.85 MIL/uL — ABNORMAL LOW (ref 3.87–5.11)
RDW: 14.6 % (ref 11.5–15.5)
WBC: 6.5 10*3/uL (ref 4.0–10.5)
nRBC: 0 % (ref 0.0–0.2)

## 2023-10-12 LAB — CK: Total CK: 698 U/L — ABNORMAL HIGH (ref 38–234)

## 2023-10-12 LAB — PROTEIN / CREATININE RATIO, URINE
Creatinine, Urine: 213 mg/dL
Protein Creatinine Ratio: 1.79 mg/mg{creat} — ABNORMAL HIGH (ref 0.00–0.15)
Total Protein, Urine: 381 mg/dL

## 2023-10-12 LAB — T3: T3, Total: 77 ng/dL (ref 71–180)

## 2023-10-12 LAB — MAGNESIUM: Magnesium: 2 mg/dL (ref 1.7–2.4)

## 2023-10-12 MED ORDER — CEPHALEXIN 500 MG PO CAPS
500.0000 mg | ORAL_CAPSULE | Freq: Two times a day (BID) | ORAL | Status: DC
  Administered 2023-10-12 – 2023-10-14 (×4): 500 mg via ORAL
  Filled 2023-10-12: qty 1
  Filled 2023-10-12: qty 2
  Filled 2023-10-12 (×2): qty 1

## 2023-10-12 NOTE — [place] FL2 (Signed)
[place] MEDICAID FL2 LEVEL OF CARE FORM     IDENTIFICATION  Patient Name: Barbara Eaton Birthdate: [birth year] Sex: female Admission Date (Current Location): 10/10/2023  [facility] and [place] Number:  Producer, television/film/video and Address:  [facility],  [address], [place] [address]      Provider Number: [id number]  Attending Physician Name and Address:  Kathlen Mody, MD  Relative Name and Phone Number:       Current Level of Care: Hospital Recommended Level of Care: Skilled Nursing Facility Prior Approval Number:    Date Approved/Denied:   PASRR Number: [id number]  Discharge Plan: SNF    Current Diagnoses: Patient Active Problem List   Diagnosis Date Noted   Sepsis secondary to UTI (HCC) 10/10/2023   Fall at home, initial encounter 10/10/2023   Hypokalemia 10/10/2023   Rhabdomyolysis 10/10/2023   Elevated troponin 10/10/2023    Orientation RESPIRATION BLADDER Height & Weight     Self, Time, Situation, Place  Normal Continent Weight: 136 lb 14.5 oz (62.1 kg) Height:  5\' 1"  (154.9 cm)  BEHAVIORAL SYMPTOMS/MOOD NEUROLOGICAL BOWEL NUTRITION STATUS      Continent Diet (see dc summary)  AMBULATORY STATUS COMMUNICATION OF NEEDS Skin   Extensive Assist Verbally Normal                       Personal Care Assistance Level of Assistance  Bathing, Feeding, Dressing Bathing Assistance: Limited assistance Feeding assistance: Independent Dressing Assistance: Limited assistance     Functional Limitations Info  Sight, Hearing, Speech Sight Info: Adequate Hearing Info: Adequate Speech Info: Adequate    SPECIAL CARE FACTORS FREQUENCY  PT (By licensed PT), OT (By licensed OT)     PT Frequency: 5x week OT Frequency: 5x week            Contractures Contractures Info: Not present    Additional Factors Info  Code Status, Allergies Code Status Info: Full Allergies Info: Penicillins, Penciclovir, Alendronate           Current  Medications (10/12/2023):  This is the current hospital active medication list Current Facility-Administered Medications  Medication Dose Route Frequency Provider Last Rate Last Admin   acetaminophen (TYLENOL) tablet 650 mg  650 mg Oral Q6H PRN Emokpae, Ejiroghene E, MD       Or   acetaminophen (TYLENOL) suppository 650 mg  650 mg Rectal Q6H PRN Emokpae, Ejiroghene E, MD       cephALEXin (KEFLEX) capsule 500 mg  500 mg Oral Q12H Kathlen Mody, MD       diclofenac Sodium (VOLTAREN) 1 % topical gel 4 g  4 g Topical QID PRN Emokpae, Ejiroghene E, MD       enoxaparin (LOVENOX) injection 40 mg  40 mg Subcutaneous Q24H Emokpae, Ejiroghene E, MD   40 mg at 10/12/23 1047   HYDROcodone-acetaminophen (NORCO/VICODIN) 5-325 MG per tablet 1 tablet  1 tablet Oral Q6H PRN Emokpae, Ejiroghene E, MD   1 tablet at 10/12/23 1047   ondansetron (ZOFRAN) tablet 4 mg  4 mg Oral Q6H PRN Emokpae, Ejiroghene E, MD       Or   ondansetron (ZOFRAN) injection 4 mg  4 mg Intravenous Q6H PRN Emokpae, Ejiroghene E, MD       polyethylene glycol (MIRALAX / GLYCOLAX) packet 17 g  17 g Oral Daily PRN Emokpae, Ejiroghene E, MD         Discharge Medications: Please see discharge summary for a list of  discharge medications.  Relevant Imaging Results:  Relevant Lab Results:   Additional Information SSN [id number] [place], [place]

## 2023-10-12 NOTE — TOC Progression Note (Signed)
Transition of Care [facility]) - Progression Note    Patient Details  Name: Barbara Eaton MRN: [id number] Date of Birth: [birth year]  Transition of Care [facility]) CM/SW Contact  Erin Sons, [place] Phone Number: 10/12/2023, 3:42 PM  Clinical Narrative:     CSW called pt's brother to discuss SNF. Brother requested CSW to contact his daughter, pt's Niece, as she is the one helping with decision making.    CSW called pt's niece. Niece explains pt lives at home alone; she has a neighbor who checks in on her. Niece is agreeable to SNF w/u. CSW explained medicare coverage for SNF. Fl2 completed and bed requests sent in hub.   Expected Discharge Plan: Skilled Nursing Facility Barriers to Discharge: Continued Medical Work up  Expected Discharge Plan and Services In-house Referral: Clinical Social Work     Living arrangements for the past 2 months: Single Family Home                                       Social Determinants of Health (SDOH) Interventions SDOH Screenings   Food Insecurity: No Food Insecurity (10/10/2023)  Housing: Low Risk  (10/10/2023)  Transportation Needs: No Transportation Needs (10/10/2023)  Utilities: Not At Risk (10/10/2023)  Financial Resource Strain: Low Risk  (06/21/2023)   Received from [facility]  Social Connections: Unknown (10/04/2023)   Received from [facility]  Tobacco Use: Low Risk  (10/11/2023)  Health Literacy: Low Risk  (06/21/2023)   Received from [facility]    Readmission Risk Interventions     No data to display

## 2023-10-12 NOTE — Plan of Care (Signed)
  Problem: Pain Managment: Goal: General experience of comfort will improve Outcome: Not Progressing   Problem: Activity: Goal: Risk for activity intolerance will decrease Outcome: Not Progressing   Problem: Clinical Measurements: Goal: Ability to maintain clinical measurements within normal limits will improve Outcome: Not Progressing

## 2023-10-12 NOTE — Plan of Care (Signed)
  Problem: Acute Rehab PT Goals(only PT should resolve) Goal: Pt will Roll Supine to Side Flowsheets (Taken 10/12/2023 1511) Pt will Roll Supine to Side: with min assist Goal: Pt Will Go Supine/Side To Sit Flowsheets (Taken 10/12/2023 1511) Pt will go Supine/Side to Sit: with minimal assist Goal: Pt Will Go Sit To Supine/Side Flowsheets (Taken 10/12/2023 1511) Pt will go Sit to Supine/Side: with minimal assist Goal: Pt Will Ambulate Flowsheets (Taken 10/12/2023 1511) Pt will Ambulate:  50 feet  with minimal assist  with rolling walker

## 2023-10-12 NOTE — Plan of Care (Signed)
Problem: Clinical Measurements: Goal: Ability to maintain clinical measurements within normal limits will improve Outcome: Progressing   Problem: Clinical Measurements: Goal: Will remain free from infection Outcome: Progressing   Problem: Clinical Measurements: Goal: Diagnostic test results will improve Outcome: Progressing

## 2023-10-12 NOTE — Evaluation (Signed)
Physical Therapy Evaluation Patient Details Name: Barbara Eaton MRN: [id number] DOB: 02-21-1951 Today's Date: 10/12/2023  History of Present Illness  per chart: Barbara Eaton is a 72 y.o. female with medical history significant for dyslipidemia.  Patient was brought to the ED via EMS after law enforcement had to make a forced entry into patient's home for a welfare check.  Patient was found on the floor.  She was awake, covered in urine and feces.  At the time of my evaluation patient is weak and alert and able to answer questions appropriately.  She does not know how long she was down, and does not remember how she got on the floor.  She reports chronic pain to her that is unchanged.  Reports some pain with urination.  Patient lives alone.  Clinical Impression  Pt insists that she has been here longer than two days.  Pt needs assist to complete exercises and to stand.  Pt unable to ambulate at this time.  Pt agreeable to SNF         If plan is discharge home, recommend the following: Two people to help with walking and/or transfers;A lot of help with bathing/dressing/bathroom;Assistance with cooking/housework;Assist for transportation   Can travel by private vehicle   No    Equipment Recommendations None recommended by PT  Recommendations for Other Services       Functional Status Assessment Patient has had a recent decline in their functional status and demonstrates the ability to make significant improvements in function in a reasonable and predictable amount of time.     Precautions / Restrictions Precautions Precautions: Fall Restrictions Weight Bearing Restrictions: No      Mobility  Bed Mobility Overal bed mobility: Needs Assistance Bed Mobility: Sidelying to Sit, Supine to Sit, Sit to Supine, Sit to Sidelying, Rolling Rolling: Min assist Sidelying to sit: Mod assist Supine to sit: Max assist Sit to supine: Max assist        Transfers Overall transfer  level: Needs assistance   Transfers: Sit to/from Stand Sit to Stand: Max assist                Ambulation/Gait   Gait Distance (Feet): 0 Feet                   Pertinent Vitals/Pain Pain Assessment Pain Assessment: 0-10 Pain Score: 7  Pain Location: ankles Pain Descriptors / Indicators: Sharp Pain Intervention(s): Limited activity within patient's tolerance    Home Living Family/patient expects to be discharged to:: Skilled nursing facility Living Arrangements: Alone                      Prior Function Prior Level of Function : Independent/Modified Independent             Mobility Comments: used crutches       Extremity/Trunk Assessment        Lower Extremity Assessment Lower Extremity Assessment: RLE deficits/detail;LLE deficits/detail RLE Deficits / Details: strength generally 2/5 LLE Deficits / Details: strength generally 2/5       Communication   Communication Communication: No apparent difficulties Cueing Techniques: Verbal cues  Cognition Arousal: Alert   Overall Cognitive Status: Within Functional Limits for tasks assessed                                          General Comments  Exercises General Exercises - Lower Extremity Ankle Circles/Pumps: Both, 10 reps Quad Sets: 10 reps Heel Slides: AAROM, 5 reps Hip ABduction/ADduction: AAROM, 5 reps   Assessment/Plan    PT Assessment Patient needs continued PT services  PT Problem List Decreased strength;Decreased range of motion;Decreased activity tolerance;Decreased balance;Decreased mobility;Pain;Decreased skin integrity       PT Treatment Interventions Gait training;Functional mobility training;Therapeutic activities;Therapeutic exercise    PT Goals (Current goals can be found in the Care Plan section)  Acute Rehab PT Goals Patient Stated Goal: To walk again PT Goal Formulation: With patient Time For Goal Achievement: 10/26/23 Potential to  Achieve Goals: Good    Frequency Min 3X/week        AM-PAC PT "6 Clicks" Mobility  Outcome Measure Help needed turning from your back to your side while in a flat bed without using bedrails?: A Little Help needed moving from lying on your back to sitting on the side of a flat bed without using bedrails?: A Lot Help needed moving to and from a bed to a chair (including a wheelchair)?: A Lot Help needed standing up from a chair using your arms (e.g., wheelchair or bedside chair)?: A Lot Help needed to walk in hospital room?: Total Help needed climbing 3-5 steps with a railing? : Total 6 Click Score: 11    End of Session Equipment Utilized During Treatment: Gait belt Activity Tolerance: Patient tolerated treatment well Patient left: in bed;with call bell/phone within reach Nurse Communication: Mobility status PT Visit Diagnosis: Unsteadiness on feet (R26.81);Muscle weakness (generalized) (M62.81);History of falling (Z91.81);Difficulty in walking, not elsewhere classified (R26.2)    Time: 9604-5409 PT Time Calculation (min) (ACUTE ONLY): 33 min   Charges:   PT Evaluation $PT Eval Moderate Complexity: 1 Mod PT Treatments $Therapeutic Exercise: 8-22 mins PT General Charges $$ ACUTE PT VISIT: 1 Visit     Virgina Organ, PT CLT [phone]  10/12/2023, 3:08 PM

## 2023-10-12 NOTE — Progress Notes (Signed)
[facility]                                                                               [place], is a 72 y.o. female, DOB - [birth year], ZOX:[id number] Admit date - 10/10/2023    Outpatient Primary MD for the patient is Pcp, No  LOS - 2  days    Brief summary    72 year old F with PMH of hyperlipidemia brought to ED by EMS after law enforcement had to make efforts at injury into patient's home for a welfare check and found patient down on the floor covered in urine and feces.     She was admitted for sepsis secondary to UTI, rhabdomyolysis, hypokalemia and elevated troponin.  Cultures obtained.  Started on IV ceftriaxone and IV fluid.  Echocardiogram ordered as well.   Assessment & Plan    Assessment and Plan: * Sepsis secondary to E. coli urinary tract infection -Urine cultures growing E. coli and blood cultures were negative. Transition IV antibiotics to oral Keflex on discharge.   Elevated troponin Patient without cardiopulmonary symptoms.  No significant delta to suggest ACS.  EKG without acute ischemic finding.  Suspect this is demand ischemia in the setting of sepsis, dehydration and rhabdomyolysis.  Echocardiogram reassuring. -No further workup indicated.  Hypokalemia Replaced repeat levels within normal limits  Fall at home, initial encounter Therapy evaluations recommending SNF  Rhabdomyolysis Improved with IV fluids.   Hyperlipidemia Resume statin on discharge     Estimated body mass index is 25.87 kg/m as calculated from the following:   Height as of this encounter: 5\' 1"  (1.549 m).   Weight as of this encounter: 62.1 kg.  Code Status: full code.  DVT Prophylaxis:  enoxaparin (LOVENOX) injection 40 mg Start: 10/11/23 1000   Level of Care: Level of care: Telemetry Family Communication: none at bedside.   Disposition Plan:     Remains inpatient appropriate:  IV ROCEPHIN  Procedures:  None.   Consultants:   None.    Antimicrobials:   Anti-infectives (From admission, onward)    Start     Dose/Rate Route Frequency Ordered Stop   10/11/23 1445  cefTRIAXone (ROCEPHIN) 1 g in sodium chloride 0.9 % 100 mL IVPB        1 g 200 mL/hr over 30 Minutes Intravenous Every 24 hours 10/11/23 0517 10/17/23 1444   10/10/23 1445  cefTRIAXone (ROCEPHIN) 2 g in sodium chloride 0.9 % 100 mL IVPB  Status:  Discontinued        2 g 200 mL/hr over 30 Minutes Intravenous Every 24 hours 10/10/23 1435 10/11/23 0517        Medications  Scheduled Meds:  enoxaparin (LOVENOX) injection  40 mg Subcutaneous Q24H   Continuous Infusions:  cefTRIAXone (ROCEPHIN)  IV 1 g (10/11/23 1650)   PRN Meds:.acetaminophen **OR** acetaminophen, diclofenac Sodium, HYDROcodone-acetaminophen, ondansetron **OR** ondansetron (ZOFRAN) IV, polyethylene glycol    Subjective:   Barbara Eaton was seen and examined today.  Wants to go home.   Objective:   Vitals:   10/11/23 0347 10/11/23 1954 10/12/23 0439 10/12/23 1357  BP: 131/60 134/64 (!) 142/6 (!) 142/74  Pulse: 95 80 78 (!)  110  Resp:   18   Temp: 98.1 F (36.7 C) 98 F (36.7 C) 98.2 F (36.8 C) 98 F (36.7 C)  TempSrc: Oral Oral Oral Oral  SpO2: 97% 97% 95% 97%  Weight:      Height:        Intake/Output Summary (Last 24 hours) at 10/12/2023 1429 Last data filed at 10/12/2023 1229 Gross per 24 hour  Intake 680 ml  Output 550 ml  Net 130 ml   Filed Weights   10/10/23 1153 10/10/23 2100  Weight: 68 kg 62.1 kg     Exam General exam: Appears calm and comfortable  Respiratory system: Clear to auscultation. Respiratory effort normal. Cardiovascular system: S1 & S2 heard, RRR.  Gastrointestinal system: Abdomen is nondistended, soft and nontender.  Central nervous system: Alert and oriented. No focal neurological deficits. Extremities: Symmetric 5 x 5 power. Skin: No rashes, lesions or ulcers Psychiatry: Mood & affect appropriate.     Data Reviewed:  I have  personally reviewed following labs and imaging studies   CBC Lab Results  Component Value Date   WBC 6.5 10/12/2023   RBC 3.85 (L) 10/12/2023   HGB 11.8 (L) 10/12/2023   HCT 35.4 (L) 10/12/2023   MCV 91.9 10/12/2023   MCH 30.6 10/12/2023   PLT 244 10/12/2023   MCHC 33.3 10/12/2023   RDW 14.6 10/12/2023   LYMPHSABS 0.5 (L) 10/10/2023   MONOABS 1.3 (H) 10/10/2023   EOSABS 0.0 10/10/2023   BASOSABS 0.1 10/10/2023     Last metabolic panel Lab Results  Component Value Date   NA 135 10/12/2023   K 4.3 10/12/2023   CL 106 10/12/2023   CO2 24 10/12/2023   BUN 31 (H) 10/12/2023   CREATININE 0.61 10/12/2023   GLUCOSE 100 (H) 10/12/2023   GFRNONAA >60 10/12/2023   CALCIUM 7.8 (L) 10/12/2023   PROT 5.0 (L) 10/12/2023   ALBUMIN 2.5 (L) 10/12/2023   BILITOT 0.7 10/12/2023   ALKPHOS 38 10/12/2023   AST 51 (H) 10/12/2023   ALT 34 10/12/2023   ANIONGAP 5 10/12/2023    CBG (last 3)  No results for input(s): "GLUCAP" in the last 72 hours.    Coagulation Profile: Recent Labs  Lab 10/10/23 1304  INR 1.1     Radiology Studies: ECHOCARDIOGRAM COMPLETE  Result Date: 10/11/2023    ECHOCARDIOGRAM REPORT   Patient Name:   Barbara Eaton Date of Exam: 10/11/2023 Medical Rec #:  [id number]         Height:       61.0 in Accession #:    [id number]        Weight:       136.9 lb Date of Birth:  Mar 31, 1951         BSA:          1.608 m Patient Age:    71 years          BP:           131/60 mmHg Patient Gender: F                 HR:           94 bpm. [facility] Procedure: 2D Echo, Cardiac Doppler and Color Doppler Indications:    Elevated Troponin  History:        Patient has no prior history of Echocardiogram examinations.  Sonographer:    Mikki Harbor Referring Phys: [phone] Heloise Beecham [facility]  Sonographer Comments:  Technically difficult study due to poor echo windows. Image acquisition challenging due to breast implants. IMPRESSIONS  1. Left ventricular ejection  fraction, by estimation, is 60 to 65%. The left ventricle has normal function. The left ventricle has no regional wall motion abnormalities. There is mild left ventricular hypertrophy. Left ventricular diastolic parameters are consistent with Grade I diastolic dysfunction (impaired relaxation).  2. Right ventricular systolic function is normal. The right ventricular size is normal. There is normal pulmonary artery systolic pressure.  3. Left atrial size was mildly dilated.  4. The mitral valve is normal in structure. No evidence of mitral valve regurgitation. No evidence of mitral stenosis.  5. Highest aortic gradient is with Pedhoff probe, mean gradient 31 mmHg. . The aortic valve was not well visualized. Aortic valve regurgitation is not visualized. Moderate aortic valve stenosis. Aortic valve peak gradient measures 34.5 mmHg. Aortic valve area, by VTI measures 1.03 cm.  6. The inferior vena cava is normal in size with greater than 50% respiratory variability, suggesting right atrial pressure of 3 mmHg. FINDINGS  Left Ventricle: Left ventricular ejection fraction, by estimation, is 60 to 65%. The left ventricle has normal function. The left ventricle has no regional wall motion abnormalities. The left ventricular internal cavity size was normal in size. There is  mild left ventricular hypertrophy. Left ventricular diastolic parameters are consistent with Grade I diastolic dysfunction (impaired relaxation). Normal left ventricular filling pressure. Right Ventricle: The right ventricular size is normal. Right vetricular wall thickness was not well visualized. Right ventricular systolic function is normal. There is normal pulmonary artery systolic pressure. The tricuspid regurgitant velocity is 2.67 m/s, and with an assumed right atrial pressure of 3 mmHg, the estimated right ventricular systolic pressure is 31.5 mmHg. Left Atrium: Left atrial size was mildly dilated. Right Atrium: Right atrial size was normal in  size. Pericardium: There is no evidence of pericardial effusion. Mitral Valve: The mitral valve is normal in structure. No evidence of mitral valve regurgitation. No evidence of mitral valve stenosis. MV peak gradient, 5.9 mmHg. The mean mitral valve gradient is 2.0 mmHg. Tricuspid Valve: The tricuspid valve is normal in structure. Tricuspid valve regurgitation is not demonstrated. No evidence of tricuspid stenosis. Aortic Valve: Highest aortic gradient is with Pedhoff probe, mean gradient 31 mmHg. The aortic valve was not well visualized. Aortic valve regurgitation is not visualized. Moderate aortic stenosis is present. Aortic valve mean gradient measures 20.8 mmHg. Aortic valve peak gradient measures 34.5 mmHg. Aortic valve area, by VTI measures 1.03 cm. Pulmonic Valve: The pulmonic valve was not well visualized. Pulmonic valve regurgitation is not visualized. No evidence of pulmonic stenosis. Aorta: The aortic root is normal in size and structure. Venous: The inferior vena cava is normal in size with greater than 50% respiratory variability, suggesting right atrial pressure of 3 mmHg. IAS/Shunts: No atrial level shunt detected by color flow Doppler.  LEFT VENTRICLE PLAX 2D LVIDd:         4.30 cm   Diastology LVIDs:         2.80 cm   LV e' medial:    6.64 cm/s LV PW:         1.10 cm   LV E/e' medial:  11.0 LV IVS:        1.20 cm   LV e' lateral:   8.05 cm/s LVOT diam:     1.90 cm   LV E/e' lateral: 9.0 LV SV:         65 LV  SV Index:   40 LVOT Area:     2.84 cm  RIGHT VENTRICLE RV Basal diam:  3.15 cm LEFT ATRIUM             Index        RIGHT ATRIUM           Index LA diam:        4.50 cm 2.80 cm/m   RA Area:     14.00 cm LA Vol (A2C):   47.7 ml 29.67 ml/m  RA Volume:   30.10 ml  18.72 ml/m LA Vol (A4C):   80.8 ml 50.26 ml/m LA Biplane Vol: 66.4 ml 41.30 ml/m  AORTIC VALVE                     PULMONIC VALVE AV Area (Vmax):    0.91 cm      PV Vmax:       1.01 m/s AV Area (Vmean):   0.77 cm      PV Peak  grad:  4.1 mmHg AV Area (VTI):     1.03 cm AV Vmax:           293.50 cm/s AV Vmean:          215.250 cm/s AV VTI:            0.628 m AV Peak Grad:      34.5 mmHg AV Mean Grad:      20.8 mmHg LVOT Vmax:         94.60 cm/s LVOT Vmean:        58.300 cm/s LVOT VTI:          0.228 m LVOT/AV VTI ratio: 0.36  AORTA Ao Root diam: 3.30 cm MITRAL VALVE                TRICUSPID VALVE MV Area (PHT): 3.37 cm     TR Peak grad:   28.5 mmHg MV Area VTI:   3.00 cm     TR Vmax:        267.00 cm/s MV Peak grad:  5.9 mmHg MV Mean grad:  2.0 mmHg     SHUNTS MV Vmax:       1.21 m/s     Systemic VTI:  0.23 m MV Vmean:      67.6 cm/s    Systemic Diam: 1.90 cm MV Decel Time: 225 msec MV E velocity: 72.80 cm/s MV A velocity: 116.00 cm/s MV E/A ratio:  0.63 Dina Rich MD Electronically signed by Dina Rich MD Signature Date/Time: 10/11/2023/2:43:36 PM    Final        Kathlen Mody M.D. [facility] 10/12/2023, 2:29 PM  Available via Epic secure chat 7am-7pm After 7 pm, please refer to night coverage provider listed on amion.

## 2023-10-13 ENCOUNTER — Encounter (HOSPITAL_COMMUNITY): Payer: Medicare Other | Admitting: Physical Therapy

## 2023-10-13 ENCOUNTER — Inpatient Hospital Stay (HOSPITAL_COMMUNITY): Payer: Medicare Other

## 2023-10-13 DIAGNOSIS — A419 Sepsis, unspecified organism: Secondary | ICD-10-CM | POA: Diagnosis not present

## 2023-10-13 DIAGNOSIS — R7989 Other specified abnormal findings of blood chemistry: Secondary | ICD-10-CM | POA: Diagnosis not present

## 2023-10-13 DIAGNOSIS — W19XXXA Unspecified fall, initial encounter: Secondary | ICD-10-CM | POA: Diagnosis not present

## 2023-10-13 DIAGNOSIS — E876 Hypokalemia: Secondary | ICD-10-CM | POA: Diagnosis not present

## 2023-10-13 NOTE — Progress Notes (Signed)
Nurse at bedside, wound care provided per  MD's and WOC orders. Dressings changed.Patient tolerated procedure well. Plan of care on going.

## 2023-10-13 NOTE — Consult Note (Addendum)
WOC Nurse Consult Note: patient found down at home  Reason for Consult: Hip wound  Wound type: 1. Partial thickness skin loss R medial knee 2.  Stage 2 Pressure Injury L hip, serum filled blister that has now evolved to reveal Stage 2   3.  Full thickness ulcer R medial foot (base of great toe) 4.  Deep Tissue Pressure Injury left 5th digit  Pressure Injury POA: Yes Measurement: 1.  Partial thickness R medial knee 7 cm x 3 cm 100% pink moist  2.  L hip Stage 2 13 cm x 9 cm total area, serum filled blister that is evolving to reveal 100% pink moist tissue  3.  Full thickness R medial foot (base of great toe) 1 cm x 1 cm 100% brown necrotic tissue with small area of pus superior aspect  4.  DTPI L 5th digit 0.3 cm x 0.3 cm 100% purple maroon discoloration   Wound bed: as above  Drainage (amount, consistency, odor) minimal serosanguinous R knee and L hip; others dry  Periwound:erythema and purulence noted around R medial foot (base of great toe) ulcer; others intact  Dressing procedure/placement/frequency: Clean R medial knee and L hip wounds with NS, apply Xeroform gauze [place] [phone]) to wound beds daily and cover with silicone foam.  May lift foam daily to replace Xeroform.  Change foam every 3 days or as needed soiling.  Clean R medial foot (base of great toe) wound with Vashe wound cleanser [place] [phone]) then apply Vashe moistened gauze to wound bed daily.  Cover with dry gauze and silicone foam or Kerlix roll gauze.  L 5th digit lateral aspect DTPI apply small piece of Xeroform gauze [place] [phone]) to maroon discoloration and cover with silicone foam.   Secure chat to primary MD regarding concerns over wound to R medial foot (base of great toe).    POC discussed with bedside nurse.  Patient appears confused at time of visit.    WOC team will not follow. Re-consult if further needs arise.   Thank you,    Priscella Mann MSN, RN-BC, [employer] [phone]

## 2023-10-13 NOTE — Progress Notes (Signed)
Physical Therapy Treatment Patient Details Name: Barbara Eaton MRN: [id number] DOB: 1951/12/23 Today's Date: 10/13/2023   History of Present Illness per chart: Barbara Eaton is a 72 y.o. female with medical history significant for dyslipidemia.  Patient was brought to the ED via EMS after law enforcement had to make a forced entry into patient's home for a welfare check.  Patient was found on the floor.  She was awake, covered in urine and feces.  At the time of my evaluation patient is weak and alert and able to answer questions appropriately.  She does not know how long she was down, and does not remember how she got on the floor.  She reports chronic pain to her that is unchanged.  Reports some pain with urination.  Patient lives alone.    PT Comments  Pt with difficultuy recalling previous call.  Required mod A with bed mobility and max A with transfer to standing with multimodal cueing for mechanics and increased time with encouragement to complete.   Presents with core and LE weakness, unsafe for gait training at this time.  Xray staff and RN present in room at [facility].   If plan is discharge home, recommend the following:     Can travel by private vehicle        Equipment Recommendations       Recommendations for Other Services       Precautions / Restrictions Precautions Precautions: Fall Restrictions Weight Bearing Restrictions: No     Mobility  Bed Mobility Overal bed mobility: Needs Assistance Bed Mobility: Sidelying to Sit, Supine to Sit, Sit to Supine, Sit to Sidelying, Rolling Rolling: Min assist, Mod assist Sidelying to sit: Mod assist Supine to sit: Mod assist Sit to supine: Min assist   General bed mobility comments: Multimodal cueing required to complete bed mobility    Transfers     Transfers: Sit to/from Stand Sit to Stand: Max assist           General transfer comment: Cueing for hand placement, presents with core and LE weakness     Ambulation/Gait                   Stairs             Wheelchair Mobility     Tilt Bed    Modified Rankin (Stroke Patients Only)       Balance                                            Cognition Arousal: Alert Behavior During Therapy:  (required cueing to stay on task) Overall Cognitive Status: Difficult to assess                                          Exercises General Exercises - Lower Extremity Ankle Circles/Pumps: Both, 10 reps Long Arc Quad: AROM, 10 reps, Seated Hip ABduction/ADduction: AAROM, 5 reps, Seated Hip Flexion/Marching: AROM, Both, 10 reps, Seated    General Comments        Pertinent Vitals/Pain Pain Assessment Pain Score:  (initially stated BLE/groin pain 12/10, then later stated minimal pain, monitored through session) Pain Descriptors / Indicators: Discomfort Pain Intervention(s): Limited activity within patient's tolerance, Repositioned, Monitored during session  Home Living                          Prior Function            PT Goals (current goals can now be found in the care plan section)      Frequency           PT Plan      Co-evaluation              AM-PAC PT "6 Clicks" Mobility   Outcome Measure  Help needed turning from your back to your side while in a flat bed without using bedrails?: A Little Help needed moving from lying on your back to sitting on the side of a flat bed without using bedrails?: A Lot Help needed moving to and from a bed to a chair (including a wheelchair)?: A Lot Help needed standing up from a chair using your arms (e.g., wheelchair or bedside chair)?: A Lot Help needed to walk in hospital room?: Total Help needed climbing 3-5 steps with a railing? : Total 6 Click Score: 11    End of Session Equipment Utilized During Treatment: Gait belt Activity Tolerance: Patient tolerated treatment well Patient left: in bed;with call  bell/phone within reach;with nursing/sitter in room Nurse Communication: Mobility status PT Visit Diagnosis: Unsteadiness on feet (R26.81);Muscle weakness (generalized) (M62.81);History of falling (Z91.81);Difficulty in walking, not elsewhere classified (R26.2)     Time: 1610-9604 PT Time Calculation (min) (ACUTE ONLY): 30 min  Charges:    $Therapeutic Activity: 23-37 mins PT General Charges $$ ACUTE PT VISIT: 1 Visit                    Becky Sax, LPTA/CLT; CBIS [phone]  Juel Burrow 10/13/2023, 10:13 AM

## 2023-10-13 NOTE — TOC Progression Note (Addendum)
Transition of Care [facility]) - Progression Note    Patient Details  Name: SIDNEY SILBERMAN MRN: [id number] Date of Birth: 1951-02-28  Transition of Care [facility]) CM/SW Contact  Villa Herb, [place] Phone Number: 10/13/2023, 2:42 PM  Clinical Narrative:    CSW spoke to pts niece to review bed offers. CSW reviewed Medicare.gov star ratings with pts niece. Pt and niece would like to accept bed offer at [facility]. Kerri at [facility] updated that pt has accepted their bed offer. TOC to follow.   Expected Discharge Plan: Skilled Nursing Facility Barriers to Discharge: Continued Medical Work up  Expected Discharge Plan and Services In-house Referral: Clinical Social Work     Living arrangements for the past 2 months: Single Family Home                                       Social Determinants of Health (SDOH) Interventions SDOH Screenings   Food Insecurity: No Food Insecurity (10/10/2023)  Housing: Low Risk  (10/10/2023)  Transportation Needs: No Transportation Needs (10/10/2023)  Utilities: Not At Risk (10/10/2023)  Financial Resource Strain: Low Risk  (06/21/2023)   Received from [facility]  Social Connections: Unknown (10/04/2023)   Received from [facility]  Tobacco Use: Low Risk  (10/11/2023)  Health Literacy: Low Risk  (06/21/2023)   Received from [facility]    Readmission Risk Interventions     No data to display

## 2023-10-13 NOTE — Progress Notes (Signed)
[facility]                                                                               Mancos, is a 72 y.o. female, DOB - 1951/08/18, GUY:[id number] Admit date - 10/10/2023    Outpatient Primary MD for the patient is Pcp, No  LOS - 3  days    Brief summary    72 year old F with PMH of hyperlipidemia brought to ED by EMS after law enforcement had to make efforts at injury into patient's home for a welfare check and found patient down on the floor covered in urine and feces.     She was admitted for sepsis secondary to UTI, rhabdomyolysis, hypokalemia and elevated troponin.  Cultures obtained.  Started on IV ceftriaxone and IV fluid.  Echocardiogram ordered as well.   Assessment & Plan    Assessment and Plan: * Sepsis secondary to E. coli urinary tract infection -Urine cultures growing E. coli and blood cultures were negative. Transition IV antibiotics to oral Keflex on discharge.   Elevated troponin Patient without cardiopulmonary symptoms.  No significant delta to suggest ACS.  EKG without acute ischemic finding.  Suspect this is demand ischemia in the setting of sepsis, dehydration and rhabdomyolysis.  Echocardiogram reassuring. -No further workup indicated.  Hypokalemia Replaced repeat levels within normal limits  Fall at home, initial encounter Therapy evaluations recommending SNF  Rhabdomyolysis Improved with IV fluids.   Hyperlipidemia Resume statin on discharge   Right foot ulcer Increased pain and tenderness. Pain control and on keflex .  Xray ordered for further evaluation.  ABI's ordered to evaluate for PAD.     Stage 2 pressure injury of the left hip:  Wound care consulted and recommendations given.    Estimated body mass index is 25.87 kg/m as calculated from the following:   Height as of this encounter: 5\' 1"  (1.549 m).   Weight as of this encounter: 62.1 kg.  Code Status: full code.  DVT Prophylaxis:  enoxaparin  (LOVENOX) injection 40 mg Start: 10/11/23 1000   Level of Care: Level of care: Telemetry Family Communication: none at bedside.   Disposition Plan:     Remains inpatient appropriate:  awaiting for SNF.   Procedures:  ABI.  Consultants:   None.   Antimicrobials:   Anti-infectives (From admission, onward)    Start     Dose/Rate Route Frequency Ordered Stop   10/12/23 1600  cephALEXin (KEFLEX) capsule 500 mg        500 mg Oral Every 12 hours 10/12/23 1448     10/11/23 1445  cefTRIAXone (ROCEPHIN) 1 g in sodium chloride 0.9 % 100 mL IVPB  Status:  Discontinued        1 g 200 mL/hr over 30 Minutes Intravenous Every 24 hours 10/11/23 0517 10/12/23 1448   10/10/23 1445  cefTRIAXone (ROCEPHIN) 2 g in sodium chloride 0.9 % 100 mL IVPB  Status:  Discontinued        2 g 200 mL/hr over 30 Minutes Intravenous Every 24 hours 10/10/23 1435 10/11/23 0517        Medications  Scheduled Meds:  cephALEXin  500 mg Oral Q12H  enoxaparin (LOVENOX) injection  40 mg Subcutaneous Q24H   Continuous Infusions:   PRN Meds:.acetaminophen **OR** acetaminophen, diclofenac Sodium, HYDROcodone-acetaminophen, ondansetron **OR** ondansetron (ZOFRAN) IV, polyethylene glycol    Subjective:   Barbara Eaton was seen and examined today.   SLIGHTLY confused.   Objective:   Vitals:   10/12/23 0439 10/12/23 1357 10/13/23 0938 10/13/23 1230  BP: (!) 142/6 (!) 142/74 (!) 145/74 (!) 155/75  Pulse: 78 (!) 110 87 86  Resp: 18   20  Temp: 98.2 F (36.8 C) 98 F (36.7 C) 98.5 F (36.9 C) 98.8 F (37.1 C)  TempSrc: Oral Oral Oral Oral  SpO2: 95% 97% 94% 96%  Weight:      Height:        Intake/Output Summary (Last 24 hours) at 10/13/2023 1333 Last data filed at 10/13/2023 1229 Gross per 24 hour  Intake 980 ml  Output 1900 ml  Net -920 ml   Filed Weights   10/10/23 1153 10/10/23 2100  Weight: 68 kg 62.1 kg     Exam General exam: Appears calm and comfortable  Respiratory system: Clear  to auscultation. Respiratory effort normal. Cardiovascular system: S1 & S2 heard, RRR. No JVD,  Gastrointestinal system: Abdomen is nondistended, soft and nontender.  Central nervous system: Alert and oriented. No focal neurological deficits. Extremities: Symmetric 5 x 5 power. Skin: stage 2 left hip pressure injury and right foot ulcer  Psychiatry: mood is appropriate.    Data Reviewed:  I have personally reviewed following labs and imaging studies   CBC Lab Results  Component Value Date   WBC 6.5 10/12/2023   RBC 3.85 (L) 10/12/2023   HGB 11.8 (L) 10/12/2023   HCT 35.4 (L) 10/12/2023   MCV 91.9 10/12/2023   MCH 30.6 10/12/2023   PLT 244 10/12/2023   MCHC 33.3 10/12/2023   RDW 14.6 10/12/2023   LYMPHSABS 0.5 (L) 10/10/2023   MONOABS 1.3 (H) 10/10/2023   EOSABS 0.0 10/10/2023   BASOSABS 0.1 10/10/2023     Last metabolic panel Lab Results  Component Value Date   NA 135 10/12/2023   K 4.3 10/12/2023   CL 106 10/12/2023   CO2 24 10/12/2023   BUN 31 (H) 10/12/2023   CREATININE 0.61 10/12/2023   GLUCOSE 100 (H) 10/12/2023   GFRNONAA >60 10/12/2023   CALCIUM 7.8 (L) 10/12/2023   PROT 5.0 (L) 10/12/2023   ALBUMIN 2.5 (L) 10/12/2023   BILITOT 0.7 10/12/2023   ALKPHOS 38 10/12/2023   AST 51 (H) 10/12/2023   ALT 34 10/12/2023   ANIONGAP 5 10/12/2023    CBG (last 3)  No results for input(s): "GLUCAP" in the last 72 hours.    Coagulation Profile: Recent Labs  Lab 10/10/23 1304  INR 1.1     Radiology Studies: US ARTERIAL ABI (SCREENING LOWER EXTREMITY)  Result Date: 10/13/2023 CLINICAL DATA:  72 year old female with a history of toe ulcer EXAM: NONINVASIVE PHYSIOLOGIC VASCULAR STUDY OF BILATERAL LOWER EXTREMITIES TECHNIQUE: Evaluation of both lower extremities was performed at rest, including calculation of ankle-brachial indices, multiple segmental pressure evaluation, segmental Doppler and segmental pulse volume recording. COMPARISON:  None Available.  FINDINGS: Right ABI:  1.28 Left ABI:  1.27 Right Lower Extremity: Segmental Doppler at the right ankle demonstrates multiphasic waveforms Left Lower Extremity: Segmental Doppler at the left ankle demonstrates multiphasic waveforms IMPRESSION: Resting ABI the bilateral lower extremities within normal limits, though potentially falsely elevated Segmental Doppler at the ankle demonstrates maintained waveforms. Signed, Yvone Neu. Loreta Ave, DO, ABVM, RPVI  [facility] Electronically Signed   By: Gilmer Mor D.O.   On: 10/13/2023 13:27   ECHOCARDIOGRAM COMPLETE  Result Date: 10/11/2023    ECHOCARDIOGRAM REPORT   Patient Name:   Barbara Eaton Date of Exam: 10/11/2023 Medical Rec #:  [id number]         Height:       61.0 in Accession #:    [id number]        Weight:       136.9 lb Date of Birth:  February 06, 1951         BSA:          1.608 m Patient Age:    71 years          BP:           131/60 mmHg Patient Gender: F                 HR:           94 bpm. [facility]am [facility] Procedure: 2D Echo, Cardiac Doppler and Color Doppler Indications:    Elevated Troponin  History:        Patient has no prior history of Echocardiogram examinations.  Sonographer:    Mikki Harbor Referring Phys: [id number] Heloise [facility]  Sonographer Comments: Technically difficult study due to poor echo windows. Image acquisition challenging due to breast implants. IMPRESSIONS  1. Left ventricular ejection fraction, by estimation, is 60 to 65%. The left ventricle has normal function. The left ventricle has no regional wall motion abnormalities. There is mild left ventricular hypertrophy. Left ventricular diastolic parameters are consistent with Grade I diastolic dysfunction (impaired relaxation).  2. Right ventricular systolic function is normal. The right ventricular size is normal. There is normal pulmonary artery systolic pressure.  3. Left atrial size was mildly dilated.  4. The  mitral valve is normal in structure. No evidence of mitral valve regurgitation. No evidence of mitral stenosis.  5. Highest aortic gradient is with Pedhoff probe, mean gradient 31 mmHg. . The aortic valve was not well visualized. Aortic valve regurgitation is not visualized. Moderate aortic valve stenosis. Aortic valve peak gradient measures 34.5 mmHg. Aortic valve area, by VTI measures 1.03 cm.  6. The inferior vena cava is normal in size with greater than 50% respiratory variability, suggesting right atrial pressure of 3 mmHg. FINDINGS  Left Ventricle: Left ventricular ejection fraction, by estimation, is 60 to 65%. The left ventricle has normal function. The left ventricle has no regional wall motion abnormalities. The left ventricular internal cavity size was normal in size. There is  mild left ventricular hypertrophy. Left ventricular diastolic parameters are consistent with Grade I diastolic dysfunction (impaired relaxation). Normal left ventricular filling pressure. Right Ventricle: The right ventricular size is normal. Right vetricular wall thickness was not well visualized. Right ventricular systolic function is normal. There is normal pulmonary artery systolic pressure. The tricuspid regurgitant velocity is 2.67 m/s, and with an assumed right atrial pressure of 3 mmHg, the estimated right ventricular systolic pressure is 31.5 mmHg. Left Atrium: Left atrial size was mildly dilated. Right Atrium: Right atrial size was normal in size. Pericardium: There is no evidence of pericardial effusion. Mitral Valve: The mitral valve is normal in structure. No evidence of mitral valve regurgitation. No evidence of mitral valve stenosis. MV peak gradient, 5.9 mmHg. The mean mitral valve gradient is 2.0 mmHg. Tricuspid Valve: The tricuspid valve is normal in structure. Tricuspid valve regurgitation  is not demonstrated. No evidence of tricuspid stenosis. Aortic Valve: Highest aortic gradient is with Pedhoff probe, mean  gradient 31 mmHg. The aortic valve was not well visualized. Aortic valve regurgitation is not visualized. Moderate aortic stenosis is present. Aortic valve mean gradient measures 20.8 mmHg. Aortic valve peak gradient measures 34.5 mmHg. Aortic valve area, by VTI measures 1.03 cm. Pulmonic Valve: The pulmonic valve was not well visualized. Pulmonic valve regurgitation is not visualized. No evidence of pulmonic stenosis. Aorta: The aortic root is normal in size and structure. Venous: The inferior vena cava is normal in size with greater than 50% respiratory variability, suggesting right atrial pressure of 3 mmHg. IAS/Shunts: No atrial level shunt detected by color flow Doppler.  LEFT VENTRICLE PLAX 2D LVIDd:         4.30 cm   Diastology LVIDs:         2.80 cm   LV e' medial:    6.64 cm/s LV PW:         1.10 cm   LV E/e' medial:  11.0 LV IVS:        1.20 cm   LV e' lateral:   8.05 cm/s LVOT diam:     1.90 cm   LV E/e' lateral: 9.0 LV SV:         65 LV SV Index:   40 LVOT Area:     2.84 cm  RIGHT VENTRICLE RV Basal diam:  3.15 cm LEFT ATRIUM             Index        RIGHT ATRIUM           Index LA diam:        4.50 cm 2.80 cm/m   RA Area:     14.00 cm LA Vol (A2C):   47.7 ml 29.67 ml/m  RA Volume:   30.10 ml  18.72 ml/m LA Vol (A4C):   80.8 ml 50.26 ml/m LA Biplane Vol: 66.4 ml 41.30 ml/m  AORTIC VALVE                     PULMONIC VALVE AV Area (Vmax):    0.91 cm      PV Vmax:       1.01 m/s AV Area (Vmean):   0.77 cm      PV Peak grad:  4.1 mmHg AV Area (VTI):     1.03 cm AV Vmax:           293.50 cm/s AV Vmean:          215.250 cm/s AV VTI:            0.628 m AV Peak Grad:      34.5 mmHg AV Mean Grad:      20.8 mmHg LVOT Vmax:         94.60 cm/s LVOT Vmean:        58.300 cm/s LVOT VTI:          0.228 m LVOT/AV VTI ratio: 0.36  AORTA Ao Root diam: 3.30 cm MITRAL VALVE                TRICUSPID VALVE MV Area (PHT): 3.37 cm     TR Peak grad:   28.5 mmHg MV Area VTI:   3.00 cm     TR Vmax:        267.00 cm/s  MV Peak grad:  5.9 mmHg MV Mean grad:  2.0 mmHg  SHUNTS MV Vmax:       1.21 m/s     Systemic VTI:  0.23 m MV Vmean:      67.6 cm/s    Systemic Diam: 1.90 cm MV Decel Time: 225 msec MV E velocity: 72.80 cm/s MV A velocity: 116.00 cm/s MV E/A ratio:  0.63 Dina Rich MD Electronically signed by Dina Rich MD Signature Date/Time: 10/11/2023/2:43:36 PM    Final        Kathlen Mody M.D. [facility] 10/13/2023, 1:33 PM  Available via Epic secure chat 7am-7pm After 7 pm, please refer to night coverage provider listed on amion.

## 2023-10-14 DIAGNOSIS — N3000 Acute cystitis without hematuria: Secondary | ICD-10-CM

## 2023-10-14 DIAGNOSIS — A419 Sepsis, unspecified organism: Secondary | ICD-10-CM | POA: Diagnosis not present

## 2023-10-14 DIAGNOSIS — T796XXD Traumatic ischemia of muscle, subsequent encounter: Secondary | ICD-10-CM | POA: Diagnosis not present

## 2023-10-14 DIAGNOSIS — E876 Hypokalemia: Secondary | ICD-10-CM | POA: Diagnosis not present

## 2023-10-14 LAB — CBC WITH DIFFERENTIAL/PLATELET
Abs Immature Granulocytes: 0.08 10*3/uL — ABNORMAL HIGH (ref 0.00–0.07)
Basophils Absolute: 0.1 10*3/uL (ref 0.0–0.1)
Basophils Relative: 1 %
Eosinophils Absolute: 0.3 10*3/uL (ref 0.0–0.5)
Eosinophils Relative: 4 %
HCT: 38.8 % (ref 36.0–46.0)
Hemoglobin: 13 g/dL (ref 12.0–15.0)
Immature Granulocytes: 1 %
Lymphocytes Relative: 24 %
Lymphs Abs: 1.8 10*3/uL (ref 0.7–4.0)
MCH: 30.6 pg (ref 26.0–34.0)
MCHC: 33.5 g/dL (ref 30.0–36.0)
MCV: 91.3 fL (ref 80.0–100.0)
Monocytes Absolute: 0.9 10*3/uL (ref 0.1–1.0)
Monocytes Relative: 13 %
Neutro Abs: 4.3 10*3/uL (ref 1.7–7.7)
Neutrophils Relative %: 57 %
Platelets: 285 10*3/uL (ref 150–400)
RBC: 4.25 MIL/uL (ref 3.87–5.11)
RDW: 14.3 % (ref 11.5–15.5)
WBC: 7.3 10*3/uL (ref 4.0–10.5)
nRBC: 0 % (ref 0.0–0.2)

## 2023-10-14 LAB — BASIC METABOLIC PANEL
Anion gap: 10 (ref 5–15)
BUN: 23 mg/dL (ref 8–23)
CO2: 28 mmol/L (ref 22–32)
Calcium: 8.7 mg/dL — ABNORMAL LOW (ref 8.9–10.3)
Chloride: 96 mmol/L — ABNORMAL LOW (ref 98–111)
Creatinine, Ser: 0.73 mg/dL (ref 0.44–1.00)
GFR, Estimated: >60 mL/min (ref >60)
Glucose, Bld: 115 mg/dL — ABNORMAL HIGH (ref 70–99)
Potassium: 3.8 mmol/L (ref 3.5–5.1)
Sodium: 134 mmol/L — ABNORMAL LOW (ref 135–145)

## 2023-10-14 MED ORDER — DICLOFENAC SODIUM 1 % EX GEL: 4.0000 g | Freq: Four times a day (QID) | CUTANEOUS | Status: DC | PRN

## 2023-10-14 MED ORDER — POLYETHYLENE GLYCOL 3350 17 G PO PACK: 17.0000 g | PACK | Freq: Every day | ORAL | 0 refills | Status: DC | PRN

## 2023-10-14 MED ORDER — CEPHALEXIN 500 MG PO CAPS: 500.0000 mg | ORAL_CAPSULE | Freq: Two times a day (BID) | ORAL | 0 refills | Status: DC

## 2023-10-14 NOTE — Care Management Important Message (Signed)
Important Message  Patient Details  Name: Barbara Eaton MRN: [id number] Date of Birth: 03-19-1951   Important Message Given:  Yes - Medicare IM     Corey Harold 10/14/2023, 11:26 AM

## 2023-10-14 NOTE — Discharge Summary (Signed)
Physician Discharge Summary   Patient: Barbara Eaton MRN: [id number] DOB: May 30, 1951  Admit date:     10/10/2023  Discharge date: 10/14/23  Discharge Physician: Kathlen Mody   PCP: Pcp, No   Recommendations at discharge:  Please follow up with PCP in one week.  Recommend outpatient follow up with wound care for the right toe ulcer and pressure injury on the hip.  Check cbc and BMP in 1 to 2 weeks.   Discharge Diagnoses: Principal Problem:   Sepsis secondary to UTI [facility]) Active Problems:   Fall at home, initial encounter   Hypokalemia   Elevated troponin   Rhabdomyolysis  Resolved Problems:   * No resolved hospital problems. *  Hospital Course: 72 year old F with PMH of hyperlipidemia brought to ED by EMS after law enforcement had to make efforts at injury into patient's home for a welfare check and found patient down on the floor covered in urine and feces.      She was admitted for sepsis secondary to UTI, rhabdomyolysis, hypokalemia and elevated troponin.  Cultures obtained.  Started on IV ceftriaxone and IV fluid.  Echocardiogram ordered as well.   Assessment and Plan:  ssessment and Plan: * Sepsis secondary to E. coli urinary tract infection -Urine cultures growing E. coli and blood cultures were negative. Transition IV antibiotics to oral Keflex on discharge.     Elevated troponin Patient without cardiopulmonary symptoms.  No significant delta to suggest ACS.  EKG without acute ischemic finding.  Suspect this is demand ischemia in the setting of sepsis, dehydration and rhabdomyolysis.  Echocardiogram reassuring. -No further workup indicated.   Hypokalemia Replaced repeat levels within normal limits   Fall at home, initial encounter Therapy evaluations recommending SNF   Rhabdomyolysis Improved with IV fluids.    Hyperlipidemia Resume statin on discharge     Right foot ulcer Increased pain and tenderness. Pain control and on keflex to complete the  course.  Xray unremarkable.  ABI's negative, .        Stage 2 pressure injury of the left hip:  Wound care consulted and recommendations given.      Estimated body mass index is 25.87 kg/m as calculated from the following:   Height as of this encounter: 5\' 1"  (1.549 m).   Weight as of this encounter: 62.1 kg.    Consultants: none Procedures performed: echo  Disposition: Skilled nursing facility Diet recommendation:  Discharge Diet Orders (From admission, onward)     Start     Ordered   10/14/23 0000  Diet - low sodium heart healthy        10/14/23 1018           Regular diet DISCHARGE MEDICATION: Allergies as of 10/14/2023       Reactions   Penicillins Hives, Rash, Other (See Comments)   Childhood allergy   Penciclovir Rash   Hives   Alendronate Nausea And Vomiting, Nausea Only   GI intolerance        Medication List     TAKE these medications    atorvastatin 10 MG tablet Commonly known as: LIPITOR Take 1 tablet by mouth daily.   cephALEXin 500 MG capsule Commonly known as: KEFLEX Take 1 capsule (500 mg total) by mouth every 12 (twelve) hours for 10 days.   diclofenac Sodium 1 % Gel Commonly known as: VOLTAREN Apply 4 g topically 4 (four) times daily as needed (Pain).   estradiol 0.1 MG/GM vaginal cream Commonly known as: ESTRACE Place 1  g vaginally 3 (three) times a week.   ibuprofen 200 MG tablet Commonly known as: ADVIL Take 200 mg by mouth every 6 (six) hours as needed for mild pain (pain score 1-3).   Multi-Vitamin tablet Take 1 tablet by mouth every morning.   polyethylene glycol 17 g packet Commonly known as: MIRALAX / GLYCOLAX Take 17 g by mouth daily as needed for mild constipation.               Discharge Care Instructions  (From admission, onward)           Start     Ordered   10/14/23 0000  Discharge wound care:       Comments: Clean R medial knee and L hip wounds with NS, apply Xeroform gauze [facility] [place] [phone]) to  wound beds daily and cover with silicone foam.  May lift foam daily to replace Xeroform.  Change foam every 3 days or as needed soiling.  2.         Clean R medial foot (base of great toe) wound with Vashe wound cleanser [facility] [place] [phone]) then apply Vashe moistened gauze to wound bed daily.  Cover with dry gauze and silicone foam or Kerlix roll gauze.  3.L 5th digit lateral aspect DTPI apply small piece of Xeroform gauze Hart [place] [phone]) to maroon discoloration and cover with silicone foam.   10/14/23 1018            Contact information for after-discharge care     Destination     [facility] .   Service: Skilled Nursing Contact information: [address] [place] [address] [phone]                    Discharge Exam: Ceasar Mons Weights   10/10/23 1153 10/10/23 2100  Weight: 68 kg 62.1 kg   General exam: Appears calm and comfortable  Respiratory system: Clear to auscultation. Respiratory effort normal. Cardiovascular system: S1 & S2 heard, RRR.  Gastrointestinal system: Abdomen is nondistended, soft and nontender.  Central nervous system: Alert and oriented person and place.  Extremities: right toe ulcer.  Skin: stage 2 pressure injury on the hip.  Psychiatry: mood is appropriate.    Condition at discharge: fair  The results of significant diagnostics from this hospitalization (including imaging, microbiology, ancillary and laboratory) are listed below for reference.   Imaging Studies: DG Foot Complete Right  Result Date: 10/13/2023 CLINICAL DATA:  Right foot ulcer. EXAM: RIGHT FOOT COMPLETE - 3+ VIEW COMPARISON:  October 10, 2023. FINDINGS: There is no evidence of fracture or dislocation. There is no evidence of arthropathy or other focal bone abnormality. Soft tissues are unremarkable. IMPRESSION: Negative. Electronically Signed   By: Lupita Raider M.D.   On: 10/13/2023 13:55   US ARTERIAL ABI (SCREENING LOWER  EXTREMITY)  Result Date: 10/13/2023 CLINICAL DATA:  72 year old female with a history of toe ulcer EXAM: NONINVASIVE PHYSIOLOGIC VASCULAR STUDY OF BILATERAL LOWER EXTREMITIES TECHNIQUE: Evaluation of both lower extremities was performed at rest, including calculation of ankle-brachial indices, multiple segmental pressure evaluation, segmental Doppler and segmental pulse volume recording. COMPARISON:  None Available. FINDINGS: Right ABI:  1.28 Left ABI:  1.27 Right Lower Extremity: Segmental Doppler at the right ankle demonstrates multiphasic waveforms Left Lower Extremity: Segmental Doppler at the left ankle demonstrates multiphasic waveforms IMPRESSION: Resting ABI the bilateral lower extremities within normal limits, though potentially falsely elevated Segmental Doppler at the ankle demonstrates maintained waveforms. Signed, Yvone Neu.  Miachel Roux, RPVI [facility] Electronically Signed   By: Gilmer Mor D.O.   On: 10/13/2023 13:27   ECHOCARDIOGRAM COMPLETE  Result Date: 10/11/2023    ECHOCARDIOGRAM REPORT   Patient Name:   ADIBA MONTANARO Date of Exam: 10/11/2023 Medical Rec #:  [id number]         Height:       61.0 in Accession #:    [id number]        Weight:       136.9 lb Date of Birth:  08-Jun-1951         BSA:          1.608 m Patient Age:    71 years          BP:           131/60 mmHg Patient Gender: F                 HR:           94 bpm. [facility] Procedure: 2D Echo, Cardiac Doppler and Color Doppler Indications:    Elevated Troponin  History:        Patient has no prior history of Echocardiogram examinations.  Sonographer:    Mikki Harbor Referring Phys: [phone] Heloise Beecham [facility]  Sonographer Comments: Technically difficult study due to poor echo windows. Image acquisition challenging due to breast implants. IMPRESSIONS  1. Left ventricular ejection fraction, by estimation, is 60 to 65%. The left ventricle has  normal function. The left ventricle has no regional wall motion abnormalities. There is mild left ventricular hypertrophy. Left ventricular diastolic parameters are consistent with Grade I diastolic dysfunction (impaired relaxation).  2. Right ventricular systolic function is normal. The right ventricular size is normal. There is normal pulmonary artery systolic pressure.  3. Left atrial size was mildly dilated.  4. The mitral valve is normal in structure. No evidence of mitral valve regurgitation. No evidence of mitral stenosis.  5. Highest aortic gradient is with Pedhoff probe, mean gradient 31 mmHg. . The aortic valve was not well visualized. Aortic valve regurgitation is not visualized. Moderate aortic valve stenosis. Aortic valve peak gradient measures 34.5 mmHg. Aortic valve area, by VTI measures 1.03 cm.  6. The inferior vena cava is normal in size with greater than 50% respiratory variability, suggesting right atrial pressure of 3 mmHg. FINDINGS  Left Ventricle: Left ventricular ejection fraction, by estimation, is 60 to 65%. The left ventricle has normal function. The left ventricle has no regional wall motion abnormalities. The left ventricular internal cavity size was normal in size. There is  mild left ventricular hypertrophy. Left ventricular diastolic parameters are consistent with Grade I diastolic dysfunction (impaired relaxation). Normal left ventricular filling pressure. Right Ventricle: The right ventricular size is normal. Right vetricular wall thickness was not well visualized. Right ventricular systolic function is normal. There is normal pulmonary artery systolic pressure. The tricuspid regurgitant velocity is 2.67 m/s, and with an assumed right atrial pressure of 3 mmHg, the estimated right ventricular systolic pressure is 31.5 mmHg. Left Atrium: Left atrial size was mildly dilated. Right Atrium: Right atrial size was normal in size. Pericardium: There is no evidence of pericardial effusion.  Mitral Valve: The mitral valve is normal in structure. No evidence of mitral valve regurgitation. No evidence of mitral valve stenosis. MV peak gradient, 5.9 mmHg. The mean mitral valve gradient is 2.0 mmHg. Tricuspid Valve: The tricuspid valve is normal  in structure. Tricuspid valve regurgitation is not demonstrated. No evidence of tricuspid stenosis. Aortic Valve: Highest aortic gradient is with Pedhoff probe, mean gradient 31 mmHg. The aortic valve was not well visualized. Aortic valve regurgitation is not visualized. Moderate aortic stenosis is present. Aortic valve mean gradient measures 20.8 mmHg. Aortic valve peak gradient measures 34.5 mmHg. Aortic valve area, by VTI measures 1.03 cm. Pulmonic Valve: The pulmonic valve was not well visualized. Pulmonic valve regurgitation is not visualized. No evidence of pulmonic stenosis. Aorta: The aortic root is normal in size and structure. Venous: The inferior vena cava is normal in size with greater than 50% respiratory variability, suggesting right atrial pressure of 3 mmHg. IAS/Shunts: No atrial level shunt detected by color flow Doppler.  LEFT VENTRICLE PLAX 2D LVIDd:         4.30 cm   Diastology LVIDs:         2.80 cm   LV e' medial:    6.64 cm/s LV PW:         1.10 cm   LV E/e' medial:  11.0 LV IVS:        1.20 cm   LV e' lateral:   8.05 cm/s LVOT diam:     1.90 cm   LV E/e' lateral: 9.0 LV SV:         65 LV SV Index:   40 LVOT Area:     2.84 cm  RIGHT VENTRICLE RV Basal diam:  3.15 cm LEFT ATRIUM             Index        RIGHT ATRIUM           Index LA diam:        4.50 cm 2.80 cm/m   RA Area:     14.00 cm LA Vol (A2C):   47.7 ml 29.67 ml/m  RA Volume:   30.10 ml  18.72 ml/m LA Vol (A4C):   80.8 ml 50.26 ml/m LA Biplane Vol: 66.4 ml 41.30 ml/m  AORTIC VALVE                     PULMONIC VALVE AV Area (Vmax):    0.91 cm      PV Vmax:       1.01 m/s AV Area (Vmean):   0.77 cm      PV Peak grad:  4.1 mmHg AV Area (VTI):     1.03 cm AV Vmax:            293.50 cm/s AV Vmean:          215.250 cm/s AV VTI:            0.628 m AV Peak Grad:      34.5 mmHg AV Mean Grad:      20.8 mmHg LVOT Vmax:         94.60 cm/s LVOT Vmean:        58.300 cm/s LVOT VTI:          0.228 m LVOT/AV VTI ratio: 0.36  AORTA Ao Root diam: 3.30 cm MITRAL VALVE                TRICUSPID VALVE MV Area (PHT): 3.37 cm     TR Peak grad:   28.5 mmHg MV Area VTI:   3.00 cm     TR Vmax:        267.00 cm/s MV Peak grad:  5.9 mmHg MV Mean grad:  2.0 mmHg     SHUNTS MV Vmax:       1.21 m/s     Systemic VTI:  0.23 m MV Vmean:      67.6 cm/s    Systemic Diam: 1.90 cm MV Decel Time: 225 msec MV E velocity: 72.80 cm/s MV A velocity: 116.00 cm/s MV E/A ratio:  0.63 Dina Rich MD Electronically signed by Dina Rich MD Signature Date/Time: 10/11/2023/2:43:36 PM    Final    CT Head Wo Contrast  Result Date: 10/10/2023 CLINICAL DATA:  Found down EXAM: CT HEAD WITHOUT CONTRAST CT CERVICAL SPINE WITHOUT CONTRAST TECHNIQUE: Multidetector CT imaging of the head and cervical spine was performed following the standard protocol without intravenous contrast. Multiplanar CT image reconstructions of the cervical spine were also generated. RADIATION DOSE REDUCTION: This exam was performed according to the departmental dose-optimization program which includes automated exposure control, adjustment of the mA and/or kV according to patient size and/or use of iterative reconstruction technique. COMPARISON:  None Available. FINDINGS: CT HEAD FINDINGS Brain: There is no acute intracranial hemorrhage, extra-axial fluid collection, or acute infarct. Parenchymal volume is normal for age. The ventricles are normal in size. Gray-white differentiation is preserved. Patchy hypodensity in the supratentorial white matter likely reflects sequela of underlying chronic small-vessel ischemic change. There is no mass lesion.  There is no mass effect or midline shift. Vascular: No hyperdense vessel or unexpected calcification.  Skull: Normal. Negative for fracture or focal lesion. Sinuses/Orbits: The paranasal sinuses are clear. Bilateral lens implants are in place. The globes and orbits are otherwise unremarkable. Other: The mastoid air cells and middle ear cavities are clear. CT CERVICAL SPINE FINDINGS Alignment: There is exaggerated cervical lordosis. There is no evidence of traumatic malalignment. Skull base and vertebrae: Skull base alignment is maintained. Vertebral body heights are preserved. There is no evidence of acute fracture. There is no suspicious osseous lesion. Soft tissues and spinal canal: No prevertebral fluid or swelling. No visible canal hematoma. Disc levels: There is mild degenerative endplate change and left facet arthropathy at C3-C4. There is no evidence of high-grade spinal canal or neural foraminal stenosis. Upper chest: The imaged lung apices are clear. Other: None. IMPRESSION: 1. No acute intracranial hemorrhage or calvarial fracture. 2. No acute fracture or traumatic malalignment of the cervical spine. Electronically Signed   By: Lesia Hausen M.D.   On: 10/10/2023 16:21   CT Cervical Spine Wo Contrast  Result Date: 10/10/2023 CLINICAL DATA:  Found down EXAM: CT HEAD WITHOUT CONTRAST CT CERVICAL SPINE WITHOUT CONTRAST TECHNIQUE: Multidetector CT imaging of the head and cervical spine was performed following the standard protocol without intravenous contrast. Multiplanar CT image reconstructions of the cervical spine were also generated. RADIATION DOSE REDUCTION: This exam was performed according to the departmental dose-optimization program which includes automated exposure control, adjustment of the mA and/or kV according to patient size and/or use of iterative reconstruction technique. COMPARISON:  None Available. FINDINGS: CT HEAD FINDINGS Brain: There is no acute intracranial hemorrhage, extra-axial fluid collection, or acute infarct. Parenchymal volume is normal for age. The ventricles are normal in  size. Gray-white differentiation is preserved. Patchy hypodensity in the supratentorial white matter likely reflects sequela of underlying chronic small-vessel ischemic change. There is no mass lesion.  There is no mass effect or midline shift. Vascular: No hyperdense vessel or unexpected calcification. Skull: Normal. Negative for fracture or focal lesion. Sinuses/Orbits: The paranasal sinuses are clear. Bilateral lens implants are in place. The globes and orbits are  otherwise unremarkable. Other: The mastoid air cells and middle ear cavities are clear. CT CERVICAL SPINE FINDINGS Alignment: There is exaggerated cervical lordosis. There is no evidence of traumatic malalignment. Skull base and vertebrae: Skull base alignment is maintained. Vertebral body heights are preserved. There is no evidence of acute fracture. There is no suspicious osseous lesion. Soft tissues and spinal canal: No prevertebral fluid or swelling. No visible canal hematoma. Disc levels: There is mild degenerative endplate change and left facet arthropathy at C3-C4. There is no evidence of high-grade spinal canal or neural foraminal stenosis. Upper chest: The imaged lung apices are clear. Other: None. IMPRESSION: 1. No acute intracranial hemorrhage or calvarial fracture. 2. No acute fracture or traumatic malalignment of the cervical spine. Electronically Signed   By: Lesia Hausen M.D.   On: 10/10/2023 16:21   DG Foot Complete Left  Result Date: 10/10/2023 CLINICAL DATA:  Fall, pain. EXAM: LEFT FOOT - COMPLETE 3+ VIEW COMPARISON:  None Available. FINDINGS: There is no evidence of fracture or dislocation. There is no evidence of arthropathy or other focal bone abnormality. Surgical hardware in the distal tibia and fibula, partially included. There is a plantar calcaneal spur. Soft tissues are unremarkable. IMPRESSION: No fracture or dislocation of the left foot. Electronically Signed   By: Narda Rutherford M.D.   On: 10/10/2023 15:41   DG  Foot Complete Right  Result Date: 10/10/2023 CLINICAL DATA:  Fall, pain. EXAM: RIGHT FOOT COMPLETE - 3+ VIEW COMPARISON:  None Available. FINDINGS: There is no evidence of fracture or dislocation. Diminutive plantar calcaneal spur. There is no evidence of arthropathy or other focal bone abnormality. Soft tissues are unremarkable. IMPRESSION: No fracture or subluxation of the right foot. Electronically Signed   By: Narda Rutherford M.D.   On: 10/10/2023 15:40   DG Knee Complete 4 Views Right  Result Date: 10/10/2023 CLINICAL DATA:  Fall, pain. EXAM: RIGHT KNEE - COMPLETE 4+ VIEW COMPARISON:  None Available. FINDINGS: No evidence of fracture, dislocation, or joint effusion. The joint spaces are preserved. There are vascular calcifications. Mild anterior soft tissue edema. IMPRESSION: Mild anterior soft tissue edema. No fracture or subluxation. Electronically Signed   By: Narda Rutherford M.D.   On: 10/10/2023 15:40   DG Chest Portable 1 View  Result Date: 10/10/2023 CLINICAL DATA:  Fall, pain. EXAM: PORTABLE CHEST 1 VIEW COMPARISON:  09/10/2023 FINDINGS: The cardiomediastinal contours are stable, unchanged aortic tortuosity. The lungs are clear. Pulmonary vasculature is normal. No consolidation, pleural effusion, or pneumothorax. No acute osseous abnormalities are seen. IMPRESSION: No acute chest findings. Electronically Signed   By: Narda Rutherford M.D.   On: 10/10/2023 15:39   DG Hips Bilat W or Wo Pelvis 3-4 Views  Result Date: 10/10/2023 CLINICAL DATA:  Fall with hip pain. EXAM: DG HIP (WITH OR WITHOUT PELVIS) 3-4V BILAT COMPARISON:  Right hip radiograph 09/07/2023 FINDINGS: No acute fracture of the pelvis or hips. Chronic right hip arthropathy with deformity of the femoral head and abnormal appearance of the hip joint. This is stable from radiographs last month. The pubic rami are intact. No pubic symphyseal or sacroiliac diastasis. IMPRESSION: 1. No acute fracture of the pelvis or hips. 2.  Chronic right hip arthropathy. Electronically Signed   By: Narda Rutherford M.D.   On: 10/10/2023 15:38   DG Shoulder Left  Result Date: 10/10/2023 CLINICAL DATA:  Fall today with pain. EXAM: LEFT SHOULDER - 2+ VIEW COMPARISON:  None Available. FINDINGS: There is no evidence of fracture or  dislocation. Joint spaces are preserved. There is no evidence of arthropathy or other focal bone abnormality. Soft tissues are unremarkable. IMPRESSION: No fracture or dislocation of the left shoulder. Electronically Signed   By: Narda Rutherford M.D.   On: 10/10/2023 15:37    Microbiology: Results for orders placed or performed during the hospital encounter of 10/10/23  Blood Culture (routine x 2)     Status: None (Preliminary result)   Collection Time: 10/10/23  1:04 PM   Specimen: BLOOD  Result Value Ref Range Status   Specimen Description BLOOD BLOOD RIGHT FOREARM  Final   Special Requests   Final    BOTTLES DRAWN AEROBIC AND ANAEROBIC Blood Culture adequate volume   Culture   Final    NO GROWTH 4 DAYS Performed at [facility], [address] [place], [place] [address]    Report Status PENDING  Incomplete  Urine Culture     Status: Abnormal   Collection Time: 10/10/23  1:11 PM   Specimen: Urine, Random  Result Value Ref Range Status   Specimen Description   Final    URINE, RANDOM Performed at [facility], [address], [place], [place] [address]    Special Requests   Final    NONE Reflexed from [id number] Performed at [facility], [address], [place], [place] [address]    Culture >=100,000 COLONIES/mL ESCHERICHIA COLI (A)  Final   Report Status 10/12/2023 FINAL  Final   Organism ID, Bacteria ESCHERICHIA COLI (A)  Final      Susceptibility   Escherichia coli - MIC*    AMPICILLIN 4 SENSITIVE Sensitive     CEFAZOLIN <=4 SENSITIVE Sensitive     CEFEPIME <=0.12 SENSITIVE Sensitive     CEFTRIAXONE <=0.25 SENSITIVE Sensitive     CIPROFLOXACIN <=0.25 SENSITIVE Sensitive     GENTAMICIN <=1  SENSITIVE Sensitive     IMIPENEM <=0.25 SENSITIVE Sensitive     NITROFURANTOIN <=16 SENSITIVE Sensitive     TRIMETH/SULFA <=20 SENSITIVE Sensitive     AMPICILLIN/SULBACTAM <=2 SENSITIVE Sensitive     PIP/TAZO <=4 SENSITIVE Sensitive ug/mL    * >=100,000 COLONIES/mL ESCHERICHIA COLI  Blood Culture (routine x 2)     Status: None (Preliminary result)   Collection Time: 10/10/23  2:47 PM   Specimen: Left Antecubital; Blood  Result Value Ref Range Status   Specimen Description LEFT ANTECUBITAL  Final   Special Requests   Final    BOTTLES DRAWN AEROBIC AND ANAEROBIC Blood Culture adequate volume   Culture   Final    NO GROWTH 4 DAYS Performed at [facility], [address] [place], [place] [address]    Report Status PENDING  Incomplete  Resp panel by RT-PCR (RSV, Flu A&B, Covid) Anterior Nasal Swab     Status: None   Collection Time: 10/10/23  4:18 PM   Specimen: Anterior Nasal Swab  Result Value Ref Range Status   SARS Coronavirus 2 by RT PCR NEGATIVE NEGATIVE Final    Comment: (NOTE) SARS-CoV-2 target nucleic acids are NOT DETECTED.  The SARS-CoV-2 RNA is generally detectable in upper respiratory specimens during the acute phase of infection. The lowest concentration of SARS-CoV-2 viral copies this assay can detect is 138 copies/mL. A negative result does not preclude SARS-Cov-2 infection and should not be used as the sole basis for treatment or other patient management decisions. A negative result may occur with  improper specimen collection/handling, submission of specimen other than nasopharyngeal swab, presence of viral mutation(s) within the areas  targeted by this assay, and inadequate number of viral copies(<138 copies/mL). A negative result must be combined with clinical observations, patient history, and epidemiological information. The expected result is Negative.  Fact Sheet for Patients:  BloggerCourse.com  Fact Sheet for Healthcare  Providers:  SeriousBroker.it  This test is no t yet approved or cleared by the [place] FDA and  has been authorized for detection and/or diagnosis of SARS-CoV-2 by FDA under an Emergency Use Authorization (EUA). This EUA will remain  in effect (meaning this test can be used) for the duration of the COVID-19 declaration under Section 564(b)(1) of the Act, 21 U.S.C.section 360bbb-3(b)(1), unless the authorization is terminated  or revoked sooner.       Influenza A by PCR NEGATIVE NEGATIVE Final   Influenza B by PCR NEGATIVE NEGATIVE Final    Comment: (NOTE) The Xpert Xpress SARS-CoV-2/FLU/RSV plus assay is intended as an aid in the diagnosis of influenza from Nasopharyngeal swab specimens and should not be used as a sole basis for treatment. Nasal washings and aspirates are unacceptable for Xpert Xpress SARS-CoV-2/FLU/RSV testing.  Fact Sheet for Patients: BloggerCourse.com  Fact Sheet for Healthcare Providers: SeriousBroker.it  This test is not yet approved or cleared by the [place] FDA and has been authorized for detection and/or diagnosis of SARS-CoV-2 by FDA under an Emergency Use Authorization (EUA). This EUA will remain in effect (meaning this test can be used) for the duration of the COVID-19 declaration under Section 564(b)(1) of the Act, 21 U.S.C. section 360bbb-3(b)(1), unless the authorization is terminated or revoked.     Resp Syncytial Virus by PCR NEGATIVE NEGATIVE Final    Comment: (NOTE) Fact Sheet for Patients: BloggerCourse.com  Fact Sheet for Healthcare Providers: SeriousBroker.it  This test is not yet approved or cleared by the [place] FDA and has been authorized for detection and/or diagnosis of SARS-CoV-2 by FDA under an Emergency Use Authorization (EUA). This EUA will remain in effect (meaning this test can be  used) for the duration of the COVID-19 declaration under Section 564(b)(1) of the Act, 21 U.S.C. section 360bbb-3(b)(1), unless the authorization is terminated or revoked.  Performed at [facility], [address] [place], [place] [address]     Labs: CBC: Recent Labs  Lab 10/10/23 1304 10/11/23 0453 10/12/23 0432 10/14/23 0434  WBC 13.8* 8.3 6.5 7.3  NEUTROABS 11.9*  --   --  4.3  HGB 17.9* 14.1 11.8* 13.0  HCT 51.5* 40.7 35.4* 38.8  MCV 87.6 88.9 91.9 91.3  PLT 396 315 244 285   Basic Metabolic Panel: Recent Labs  Lab 10/10/23 1304 10/10/23 1447 10/11/23 0453 10/12/23 0432 10/14/23 0434  NA 135  --  135 135 134*  K 2.4*  --  3.4* 4.3 3.8  CL 93*  --  101 106 96*  CO2 27  --  23 24 28   GLUCOSE 131*  --  120* 100* 115*  BUN 43*  --  49* 31* 23  CREATININE 1.08*  --  0.92 0.61 0.73  CALCIUM 9.0  --  7.9* 7.8* 8.7*  MG  --  2.1  --  2.0  --    Liver Function Tests: Recent Labs  Lab 10/10/23 1304 10/12/23 0432  AST 71* 51*  ALT 37 34  ALKPHOS 59 38  BILITOT 1.3* 0.7  PROT 7.3 5.0*  ALBUMIN 3.7 2.5*   CBG: No results for input(s): "GLUCAP" in the last 168 hours.  Discharge time spent: 42 minutes.   Signed: Edson Snowball  Blake Divine, MD [facility] 10/14/2023

## 2023-10-14 NOTE — TOC Transition Note (Signed)
Transition of Care [facility]) - CM/SW Discharge Note   Patient Details  Name: Barbara Eaton MRN: [id number] Date of Birth: 05-18-1951  [facility] CM/SW Contact:  [name], LCSW Phone Number: 10/14/2023, 10:43 AM   Clinical Narrative:     Pt medically stable for dc and PNC can admit today. Pt/niece remain in agreement with dc plan.  DC clinical sent electronically. RN to call report.  No other TOC needs for dc.  Final next level of care: Skilled Nursing Facility Barriers to Discharge: Continued Medical Work up   Patient Goals and CMS Choice      Discharge Placement                Patient chooses bed at: [facility] Patient to be transferred to facility by: w/c Name of family member notified: [name] Patient and family notified of of transfer: 10/14/23  Discharge Plan and Services Additional resources added to the After Visit Summary for   In-house Referral: Clinical Social Work                                   Social Determinants of Health (SDOH) Interventions SDOH Screenings   Food Insecurity: No Food Insecurity (10/10/2023)  Housing: Low Risk  (10/10/2023)  Transportation Needs: No Transportation Needs (10/10/2023)  Utilities: Not At Risk (10/10/2023)  Financial Resource Strain: Low Risk  (06/21/2023)   Received [facility]  Social Connections: Unknown (10/04/2023)   Received from [facility]  Tobacco Use: Low Risk  (10/11/2023)  Health Literacy: Low Risk  (06/21/2023)   Received from [facility]     Readmission Risk Interventions     No data to display

## 2023-10-15 LAB — CULTURE, BLOOD (ROUTINE X 2)
Culture: NO GROWTH
Culture: NO GROWTH
Special Requests: ADEQUATE
Special Requests: ADEQUATE

## 2023-10-17 ENCOUNTER — Encounter: Payer: Self-pay | Admitting: Adult Health

## 2023-10-17 ENCOUNTER — Non-Acute Institutional Stay (SKILLED_NURSING_FACILITY): Payer: Self-pay | Admitting: Adult Health

## 2023-10-17 DIAGNOSIS — E7439 Other disorders of intestinal carbohydrate absorption: Secondary | ICD-10-CM

## 2023-10-17 DIAGNOSIS — N952 Postmenopausal atrophic vaginitis: Secondary | ICD-10-CM

## 2023-10-17 DIAGNOSIS — T796XXD Traumatic ischemia of muscle, subsequent encounter: Secondary | ICD-10-CM

## 2023-10-17 DIAGNOSIS — E782 Mixed hyperlipidemia: Secondary | ICD-10-CM | POA: Insufficient documentation

## 2023-10-17 DIAGNOSIS — N390 Urinary tract infection, site not specified: Secondary | ICD-10-CM

## 2023-10-17 DIAGNOSIS — E049 Nontoxic goiter, unspecified: Secondary | ICD-10-CM

## 2023-10-17 DIAGNOSIS — A419 Sepsis, unspecified organism: Secondary | ICD-10-CM | POA: Diagnosis not present

## 2023-10-17 NOTE — Progress Notes (Signed)
Location:  [facility] Room Number: 157 Place of Service:  SNF (31)   CODE STATUS: full   Allergies  Allergen Reactions   Penicillins Hives, Rash and Other (See Comments)    Childhood allergy   Penciclovir Rash    Hives   Alendronate Nausea And Vomiting and Nausea Only    GI intolerance    Chief Complaint  Patient presents with   Hospitalization Follow-up    HPI:  She is a 72 year old woman who has been hospitalized from 10-10-23 through 10-14-23. Her medical history includes hyperlipidemia; glucose intolerance; vaginal atrophy. She presented to the ED after a wellness check was found on the floor. She was treated for uti with sepsis and rhabdomyolysis. She was started on IV ceftriaxone and IVF.  She has been discharged on keflex for 10 days. She is here for short term rehab with her goal to return back home. She will continue to be followed for her chronic illnesses including:   Mixed hyperlipidemia:   Glucose intolerance: Vaginal atrophy    Past Medical History:  Diagnosis Date   Glucose intolerance    Hyperlipidemia    Hypokalemia 10/10/2023   Rhabdomyolysis 10/10/2023   Sepsis secondary to UTI (HCC) 10/10/2023   Venous stasis     Past Surgical History:  Procedure Laterality Date   AUGMENTATION MAMMAPLASTY Bilateral 2013-14   Mentor, In front of muscle. Bilateral lift    Social History   Socioeconomic History   Marital status: Married    Spouse name: Not on file   Number of children: Not on file   Years of education: Not on file   Highest education level: Not on file  Occupational History   Not on file  Tobacco Use   Smoking status: Never   Smokeless tobacco: Never  Substance and Sexual Activity   Alcohol use: Not on file   Drug use: Never   Sexual activity: Not Currently  Other Topics Concern   Not on file  Social History Narrative   Not on file   Social Determinants of Health   Financial Resource Strain: Low Risk   (06/21/2023)   Received from [facility]   Overall Financial Resource Strain (CARDIA)    Difficulty of Paying Living Expenses: Not very hard  Food Insecurity: No Food Insecurity (10/10/2023)   Hunger Vital Sign    Worried About Running Out of Food in the Last Year: Never true    Ran Out of Food in the Last Year: Never true  Transportation Needs: No Transportation Needs (10/10/2023)   PRAPARE - Administrator, Civil Service (Medical): No    Lack of Transportation (Non-Medical): No  Physical Activity: Not on file  Stress: Not on file  Social Connections: Unknown (10/04/2023)   Received from [facility]   Social Network    Social Network: Not on file  Intimate Partner Violence: Not At Risk (10/10/2023)   Humiliation, Afraid, Rape, and Kick questionnaire    Fear of Current or Ex-Partner: No    Emotionally Abused: No    Physically Abused: No    Sexually Abused: No   Family History  Problem Relation Age of Onset   Breast cancer Mother 96      VITAL SIGNS BP 138/66   Pulse 84   Temp 98.3 F (36.8 C)   Resp 20   Ht 5\' 1"  (1.549 m)   Wt 133 lb (60.3 kg)   SpO2 95%  BMI 25.13 kg/m   Outpatient Encounter Medications as of 10/17/2023  Medication Sig   atorvastatin (LIPITOR) 10 MG tablet Take 1 tablet by mouth daily.   cephALEXin (KEFLEX) 500 MG capsule Take 1 capsule (500 mg total) by mouth every 12 (twelve) hours for 10 days.   estradiol (ESTRACE) 0.1 MG/GM vaginal cream Place 1 g vaginally 3 (three) times a week.   ibuprofen (ADVIL) 200 MG tablet Take 200 mg by mouth every 6 (six) hours as needed for mild pain (pain score 1-3).   Multiple Vitamin (MULTI-VITAMIN) tablet Take 1 tablet by mouth every morning.   polyethylene glycol (MIRALAX / GLYCOLAX) 17 g packet Take 17 g by mouth daily as needed for mild constipation.   [DISCONTINUED] diclofenac Sodium (VOLTAREN) 1 % GEL Apply 4 g topically 4 (four) times daily as needed (Pain).   No facility-administered  encounter medications on file as of 10/17/2023.     SIGNIFICANT DIAGNOSTIC EXAMS  TODAY  10-10-23: wbc 13.8; hgb 17.9; hct 51.5; mcv 87.6 plt 396; glucose 131; bun 43; creat 1.08; k+ 2.4; na++ 135; ca 9.0; gfr 55; protein 7.3 albumin 3.7 10-11-23: tsh 3.452 free t4: 1.47 10-14-23: wbc 7.3; hgb 13.0; hct 38.8; mcv 91.3 plt 285; glucose 115; bun 23; creat 0.73; k+ 3.8; na++ 134; ca 8.7; gfr >60  Review of Systems  Constitutional:  Negative for malaise/fatigue.  Respiratory:  Negative for cough and shortness of breath.   Cardiovascular:  Negative for chest pain, palpitations and leg swelling.  Gastrointestinal:  Negative for abdominal pain, constipation and heartburn.  Musculoskeletal:  Negative for back pain, joint pain and myalgias.  Skin: Negative.   Neurological:  Negative for dizziness.  Psychiatric/Behavioral:  The patient is not nervous/anxious.    Physical Exam Constitutional:      General: She is not in acute distress.    Appearance: She is well-developed. She is not diaphoretic.  HENT:     Mouth/Throat:     Mouth: Mucous membranes are moist.     Pharynx: Oropharynx is clear.  Neck:     Thyroid: Thyromegaly present.  Cardiovascular:     Rate and Rhythm: Normal rate and regular rhythm.     Pulses: Normal pulses.     Heart sounds: Normal heart sounds.  Pulmonary:     Effort: Pulmonary effort is normal. No respiratory distress.     Breath sounds: Normal breath sounds.  Abdominal:     General: Bowel sounds are normal. There is no distension.     Palpations: Abdomen is soft.     Tenderness: There is no abdominal tenderness.  Musculoskeletal:        General: Normal range of motion.     Cervical back: Neck supple.     Right lower leg: No edema.     Left lower leg: No edema.  Lymphadenopathy:     Cervical: No cervical adenopathy.  Skin:    General: Skin is warm and dry.     Comments: Left hip with wound: is excoriated pink wound bed.   Neurological:     Mental  Status: She is alert. Mental status is at baseline.     Comments: Delayed verbal response   Psychiatric:        Mood and Affect: Mood normal.     ASSESSMENT/ PLAN:  TODAY  Sepsis secondary to UTI: will complete keflex and will continue to monitor her status.   2. Rhabdomyolysis traumatic subsequent encounter: Ck is returning to normal will monitor her labs.  3. Mixed hyperlipidemia: cholesterol 235; ldl 127: will continue lipitor 10 mg daily   4. Glucose intolerance: hgb A1c 5.5  5. Vaginal atrophy uses estrace 1 gm three times weekly   6. Goiter: mildly elevated free t4: 1.47 will monitor    Synthia Innocent NP [facility]  fucall [phone]

## 2023-10-18 ENCOUNTER — Encounter (HOSPITAL_COMMUNITY): Payer: Medicare Other | Admitting: Physical Therapy

## 2023-10-19 ENCOUNTER — Ambulatory Visit: Payer: 59 | Admitting: Family Medicine

## 2023-10-20 ENCOUNTER — Encounter (HOSPITAL_COMMUNITY): Payer: Medicare Other

## 2023-10-20 ENCOUNTER — Encounter: Payer: Self-pay | Admitting: Family Medicine

## 2023-10-24 ENCOUNTER — Non-Acute Institutional Stay (SKILLED_NURSING_FACILITY): Payer: Self-pay | Admitting: Adult Health

## 2023-10-24 ENCOUNTER — Encounter: Payer: Self-pay | Admitting: Adult Health

## 2023-10-24 DIAGNOSIS — B9620 Unspecified Escherichia coli [E. coli] as the cause of diseases classified elsewhere: Secondary | ICD-10-CM | POA: Diagnosis not present

## 2023-10-24 DIAGNOSIS — N390 Urinary tract infection, site not specified: Secondary | ICD-10-CM

## 2023-10-24 NOTE — Progress Notes (Unsigned)
Location:  [facility] Room Number: 157 Place of Service:  SNF (31)   CODE STATUS: full   Allergies  Allergen Reactions   Penicillins Hives, Rash and Other (See Comments)    Childhood allergy   Penciclovir Rash    Hives   Alendronate Nausea And Vomiting and Nausea Only    GI intolerance    Chief Complaint  Patient presents with   Acute Visit    Change in status     HPI:  She is presently being treated for an uti with cephalexin per her discharge orders. She is disorientated; has called 911; is having delusional thought patterns. Today she is mumbling. There are no reports of fever present.   Past Medical History:  Diagnosis Date   Glucose intolerance    Hyperlipidemia    Hypokalemia 10/10/2023   Rhabdomyolysis 10/10/2023   Sepsis secondary to UTI (HCC) 10/10/2023   Venous stasis     Past Surgical History:  Procedure Laterality Date   AUGMENTATION MAMMAPLASTY Bilateral 2013-14   Mentor, In front of muscle. Bilateral lift    Social History   Socioeconomic History   Marital status: Married    Spouse name: Not on file   Number of children: Not on file   Years of education: Not on file   Highest education level: Not on file  Occupational History   Not on file  Tobacco Use   Smoking status: Never   Smokeless tobacco: Never  Substance and Sexual Activity   Alcohol use: Not on file   Drug use: Never   Sexual activity: Not Currently  Other Topics Concern   Not on file  Social History Narrative   Not on file   Social Determinants of Health   Financial Resource Strain: Low Risk  (06/21/2023)   Received from [facility]   Overall Financial Resource Strain (CARDIA)    Difficulty of Paying Living Expenses: Not very hard  Food Insecurity: No Food Insecurity (10/10/2023)   Hunger Vital Sign    Worried About Running Out of Food in the Last Year: Never true    Ran Out of Food in the Last Year: Never true  Transportation Needs: No  Transportation Needs (10/10/2023)   PRAPARE - Administrator, Civil Service (Medical): No    Lack of Transportation (Non-Medical): No  Physical Activity: Not on file  Stress: Not on file  Social Connections: Unknown (10/04/2023)   Received from [facility]   Social Network    Social Network: Not on file  Intimate Partner Violence: Not At Risk (10/10/2023)   Humiliation, Afraid, Rape, and Kick questionnaire    Fear of Current or Ex-Partner: No    Emotionally Abused: No    Physically Abused: No    Sexually Abused: No   Family History  Problem Relation Age of Onset   Breast cancer Mother 45      VITAL SIGNS BP 131/71   Pulse 84   Temp 98.4 F (36.9 C)   Resp 20   Ht 5\' 1"  (1.549 m)   Wt 134 lb 9.6 oz (61.1 kg)   SpO2 97%   BMI 25.43 kg/m   Outpatient Encounter Medications as of 10/24/2023  Medication Sig   atorvastatin (LIPITOR) 10 MG tablet Take 1 tablet by mouth daily.   cephALEXin (KEFLEX) 500 MG capsule Take 1 capsule (500 mg total) by mouth every 12 (twelve) hours for 10 days.   estradiol (ESTRACE) 0.1 MG/GM vaginal  cream Place 1 g vaginally 3 (three) times a week.   ibuprofen (ADVIL) 200 MG tablet Take 200 mg by mouth every 6 (six) hours as needed for mild pain (pain score 1-3).   Multiple Vitamin (MULTI-VITAMIN) tablet Take 1 tablet by mouth every morning.   polyethylene glycol (MIRALAX / GLYCOLAX) 17 g packet Take 17 g by mouth daily as needed for mild constipation.   No facility-administered encounter medications on file as of 10/24/2023.     SIGNIFICANT DIAGNOSTIC EXAMS  PREVIOUS   10-10-23: wbc 13.8; hgb 17.9; hct 51.5; mcv 87.6 plt 396; glucose 131; bun 43; creat 1.08; k+ 2.4; na++ 135; ca 9.0; gfr 55; protein 7.3 albumin 3.7 urine culture: e-coli: cipro 10-11-23: tsh 3.452 free t4: 1.47 10-14-23: wbc 7.3; hgb 13.0; hct 38.8; mcv 91.3 plt 285; glucose 115; bun 23; creat 0.73; k+ 3.8; na++ 134; ca 8.7; gfr >60  Review of Systems  Reason  unable to perform ROS: unable to fully participate today.   Physical Exam Constitutional:      General: She is not in acute distress.    Appearance: She is well-developed. She is not diaphoretic.  Neck:     Thyroid: Thyromegaly present.  Cardiovascular:     Rate and Rhythm: Normal rate and regular rhythm.     Pulses: Normal pulses.     Heart sounds: Normal heart sounds.  Pulmonary:     Effort: Pulmonary effort is normal. No respiratory distress.     Breath sounds: Normal breath sounds.  Abdominal:     General: Bowel sounds are normal. There is no distension.     Palpations: Abdomen is soft.     Tenderness: There is no abdominal tenderness.  Musculoskeletal:        General: Normal range of motion.     Cervical back: Neck supple.     Right lower leg: No edema.     Left lower leg: No edema.  Lymphadenopathy:     Cervical: No cervical adenopathy.  Skin:    General: Skin is warm and dry.  Neurological:     Mental Status: She is alert.     Comments: Has delayed verbal response; is mumbling today       ASSESSMENT/ PLAN:  TODAY  E-coli UTI: due to her sensitivity will stop cephalexin and will begin cipro 500 mg twice daily through 10-27-23    Synthia Innocent NP [facility]  call [phone]

## 2023-10-25 ENCOUNTER — Non-Acute Institutional Stay (SKILLED_NURSING_FACILITY): Payer: Medicare Other | Admitting: Internal Medicine

## 2023-10-25 ENCOUNTER — Encounter (HOSPITAL_COMMUNITY): Payer: Medicare Other

## 2023-10-25 ENCOUNTER — Encounter: Payer: Self-pay | Admitting: Internal Medicine

## 2023-10-25 DIAGNOSIS — T796XXD Traumatic ischemia of muscle, subsequent encounter: Secondary | ICD-10-CM

## 2023-10-25 DIAGNOSIS — E7439 Other disorders of intestinal carbohydrate absorption: Secondary | ICD-10-CM

## 2023-10-25 DIAGNOSIS — R030 Elevated blood-pressure reading, without diagnosis of hypertension: Secondary | ICD-10-CM

## 2023-10-25 DIAGNOSIS — R7989 Other specified abnormal findings of blood chemistry: Secondary | ICD-10-CM

## 2023-10-25 DIAGNOSIS — F39 Unspecified mood [affective] disorder: Secondary | ICD-10-CM | POA: Insufficient documentation

## 2023-10-25 DIAGNOSIS — E876 Hypokalemia: Secondary | ICD-10-CM | POA: Diagnosis not present

## 2023-10-25 DIAGNOSIS — A419 Sepsis, unspecified organism: Secondary | ICD-10-CM | POA: Diagnosis not present

## 2023-10-25 DIAGNOSIS — N390 Urinary tract infection, site not specified: Secondary | ICD-10-CM

## 2023-10-25 NOTE — Assessment & Plan Note (Signed)
Keflex prescribed at discharge was changed to Cipro as this was not among the antibiotic sensitivities listed on the urine culture.  Cipro will be continued through 10/31.

## 2023-10-25 NOTE — Assessment & Plan Note (Signed)
She denies any cardiopulmonary symptoms.  There is trace edema.  She also has loud grade 2 systolic murmur in the context of a history of rheumatic fever.  Monitor weight at SNF.

## 2023-10-25 NOTE — Assessment & Plan Note (Signed)
While hospitalized glucoses ranged from a low of 100 up to high of 131.  Glucose monitor will continue in SNF.

## 2023-10-25 NOTE — Assessment & Plan Note (Signed)
MMSE will be completed at the facility if she is adherent with such.  Outpatient follow-up with PCP once at baseline.

## 2023-10-25 NOTE — Assessment & Plan Note (Signed)
Her only significant musculoskeletal complaints this time is pain in the right knee. Risk of gastritis and/or AKI with ibuprofen use discussed with her.  If she is not has significant symptoms topical Voltaren gel could be initiated.

## 2023-10-25 NOTE — Progress Notes (Addendum)
NURSING HOME LOCATION:  [facility] ROOM NUMBER: 157 W   CODE STATUS:  Full Code   PCP: She stated she previously saw Dr. Jules Husbands with [facility] but has affiliated with a new practice. At present she can not remember the  physician's name.  This is a comprehensive admission note to this SNFperformed on this date less than 30 days from date of admission. Included are preadmission medical/surgical history; reconciled medication list; family history; social history and comprehensive review of systems.  Corrections and additions to the records were documented. Comprehensive physical exam was also performed. Additionally a clinical summary was entered for each active diagnosis pertinent to this admission in the Problem List to enhance continuity of care.  HPI: She was hospitalized 10/14 - 10/14/2023 with E. coli UTI with associated sepsis.  She was found on a welfare check after she had not been seen for 2 days.  In the ED she was empirically placed on IV ceftriaxone and rehydration initiated.  Culture subsequently revealed E. coli.  Blood cultures were negative.  She was found to have rhabdomyolysis with a peak CK of 2327.  With rehydration it was 698 prior to discharge.  High Sensitivity Troponin peaked at 184 and was attributed to stress ischemia as there was no evidence of ACS on 10/14 EKG ( personally reviewed; NSST-T changes present w/o STEMI).  Sepsis criteria were met with tachycardia and leukocytosis of 13,800.  Lactic acid level was normal at 1.7.  Critical potassium was present with a value of 2.4; with supplementation final value was 3.8.  BUN peaked at 49 but was corrected to 23.  GFR remained greater than 60 throughout hospitalizations.  Glucose ranged from 100 up to high of 131.  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  She was transitioned to Keflex at discharge.  Here at the SNF review of the C&S  sensitivities did not include Keflex and she was placed on Cipro to be continued through 10/31. She was discharged to the skilled facility for rehab.  Past medical and surgical history: Includes history of glucose intolerance, dyslipidemia, and venous stasis. She states she had rheumatic fever as a child. Surgeries include augmentation mammoplasty.  Family history: reviewed, non contributory due to advanced age.  Social history:non drinker , non smoker. Former Museum/gallery exhibitions officer.   Review of systems: She was reluctant to be interviewed demanding to see my identification.  She also wanted a copy of my identity cards.  The DON came in to assist in the interview and exam. She had no comprehension of while she was hospitalized.  She fixated on the police breaking into her home.  She was agitated about the " sudden pounding and banging & breaking of glass before they came in with black material suitcases." She was worried that it would scare her 35 year old dog and 2 cats.  She also was upset that someone had called to initiate the welfare check "just because I had not been heard from for 2 days."  Review tof systems: She denied any active symptoms except for some pain in the right knee.  Despite the elevated CK and troponin she had denied any significant cardio pulmonary symptoms.  She also denies any GU symptoms.  Constitutional: No fever, significant weight change, fatigue  Eyes: No redness, discharge, pain, vision change ENT/mouth: No nasal congestion, purulent discharge, earache, change in hearing, sore throat  Cardiovascular: No chest pain,  palpitations, paroxysmal nocturnal dyspnea, claudication, edema  Respiratory: No cough, sputum production, hemoptysis, DOE, significant snoring, apnea Gastrointestinal: No heartburn, dysphagia, abdominal pain, nausea /vomiting, rectal bleeding, melena, change in bowels Genitourinary: No dysuria, hematuria, pyuria, incontinence, nocturia Dermatologic:  No rash, pruritus, change in appearance of skin Neurologic: No dizziness, headache, syncope, seizures, numbness, tingling Psychiatric: No significant anxiety, depression, insomnia, anorexia Endocrine: No change in hair/skin/nails, excessive thirst, excessive hunger, excessive urination  Hematologic/lymphatic: No significant bruising, lymphadenopathy, abnormal bleeding Allergy/immunology: No itchy/watery eyes, significant sneezing, urticaria, angioedema  Physical exam:  Pertinent or positive findings: She was very suspicious of me & my purpose in approaching her, very resistant to being interviewed.   Subsequently the DON stated that the patient called the police while hospitalized and again after she was admitted here to the SNF. Hair is somewhat unkempt.  She exhibits some pallor.  Eyebrows are decreased laterally.  There is slight decrease in the left nasolabial fold.  A grade 2 systolic murmur is present at the base with radiation into the carotids.  There is slight protuberance of the abdomen.  Pedal pulses are decreased to palpation.  She has 1/2+ edema at the sock line.  There is an irregular eschar at the right medial knee.  General appearance: no acute distress, increased work of breathing is present.   Lymphatic: No lymphadenopathy about the head, neck, axilla. Eyes: No conjunctival inflammation or lid edema is present. There is no scleral icterus. Ears:  External ear exam shows no significant lesions or deformities.   Nose:  External nasal examination shows no deformity or inflammation. Nasal mucosa are pink and moist without lesions, exudates Oral exam: Lips and gums are healthy appearing.There is no oropharyngeal erythema or exudate. Neck:  No thyromegaly, masses, tenderness noted.    Heart:  Normal rate and regular rhythm. S1 and S2 normal without gallop, click, rub.  Lungs: Chest clear to auscultation without wheezes, rhonchi, rales, rubs. Abdomen: Bowel sounds are normal.  Abdomen  is soft and nontender with no organomegaly, hernias, masses. GU: Deferred  Extremities:  No cyanosis, clubbing Neurologic exam:  Balance, Rhomberg, finger to nose testing could not be completed due to clinical state Deep tendon reflexes are equal Skin: Warm & dry w/o tenting. No significant rash.  See clinical summary under each active problem in the Problem List with associated updated therapeutic plan

## 2023-10-25 NOTE — Assessment & Plan Note (Signed)
At discharge following repletion potassium was 3.8.  Med list reviewed; there is no evidence of any medication related hypokalemia etiology.  Continue to monitor.

## 2023-10-25 NOTE — Patient Instructions (Signed)
See assessment and plan under each diagnosis in the problem list and acutely for this visit 

## 2023-10-26 DIAGNOSIS — N390 Urinary tract infection, site not specified: Secondary | ICD-10-CM | POA: Insufficient documentation

## 2023-10-26 DIAGNOSIS — B9620 Unspecified Escherichia coli [E. coli] as the cause of diseases classified elsewhere: Secondary | ICD-10-CM | POA: Insufficient documentation

## 2023-10-27 ENCOUNTER — Encounter (HOSPITAL_COMMUNITY): Payer: Medicare Other

## 2023-10-27 DIAGNOSIS — R030 Elevated blood-pressure reading, without diagnosis of hypertension: Secondary | ICD-10-CM | POA: Insufficient documentation

## 2023-10-27 NOTE — Assessment & Plan Note (Signed)
BP is outlier; monitor BP & verify average. Hydrochlorothiazide not option due to profound hypokalemia.

## 2023-10-28 ENCOUNTER — Non-Acute Institutional Stay (SKILLED_NURSING_FACILITY): Payer: Medicare Other | Admitting: Adult Health

## 2023-10-28 ENCOUNTER — Encounter: Payer: Self-pay | Admitting: Adult Health

## 2023-10-28 DIAGNOSIS — A419 Sepsis, unspecified organism: Secondary | ICD-10-CM | POA: Diagnosis not present

## 2023-10-28 DIAGNOSIS — E049 Nontoxic goiter, unspecified: Secondary | ICD-10-CM | POA: Diagnosis not present

## 2023-10-28 DIAGNOSIS — R4189 Other symptoms and signs involving cognitive functions and awareness: Secondary | ICD-10-CM | POA: Diagnosis not present

## 2023-10-28 DIAGNOSIS — F39 Unspecified mood [affective] disorder: Secondary | ICD-10-CM | POA: Diagnosis not present

## 2023-10-28 DIAGNOSIS — N390 Urinary tract infection, site not specified: Secondary | ICD-10-CM

## 2023-10-28 NOTE — Progress Notes (Signed)
Location:  [facility] Room Number: 157W Place of Service:  SNF (31)   CODE STATUS: Full Code  Allergies  Allergen Reactions   Penicillins Hives, Rash and Other (See Comments)    Childhood allergy   Penciclovir Rash    Hives   Alendronate Nausea And Vomiting and Nausea Only    GI intolerance    Chief Complaint  Patient presents with   Acute Visit    care plan meeting    HPI:  We have come together for her care plan meeting. Family present.  BIMS 7/15 mood 2/30: nervous at times. She has been delusional and has called 911.  Not using wheelchair; has had 3 falls from bed without injury. She requires mod to dependent assist with her adl care; will decline care at times. She is incontinent of bladder and frequently incontinent of bowel. . Dietary: regular  diet; appetite 76-100%; setup for meals. Weight is 134 pounds. Therapy: limited participation in any therapy; min assist upper body; max for lower body; brp: max assist; transfers: max assist; moderately impaired  cognition BCAT 10-26-23: 24/50.  short term recall 30-35%; will decline at times. Activities: reading, newspaper. She will continue to be followed for her chronic illnesses including:  Sepsis secondary to UTI Mood disorder   Goiter  Past Medical History:  Diagnosis Date   Glucose intolerance    Hyperlipidemia    Hypokalemia 10/10/2023   Rhabdomyolysis 10/10/2023   Sepsis secondary to UTI (HCC) 10/10/2023   Venous stasis     Past Surgical History:  Procedure Laterality Date   AUGMENTATION MAMMAPLASTY Bilateral 2013-14   Mentor, In front of muscle. Bilateral lift    Social History   Socioeconomic History   Marital status: Married    Spouse name: Not on file   Number of children: Not on file   Years of education: Not on file   Highest education level: Not on file  Occupational History   Not on file  Tobacco Use   Smoking status: Never   Smokeless tobacco: Never  Substance and Sexual  Activity   Alcohol use: Not on file   Drug use: Never   Sexual activity: Not Currently  Other Topics Concern   Not on file  Social History Narrative   Not on file   Social Determinants of Health   Financial Resource Strain: Low Risk  (06/21/2023)   Received from [facility]   Overall Financial Resource Strain (CARDIA)    Difficulty of Paying Living Expenses: Not very hard  Food Insecurity: No Food Insecurity (10/10/2023)   Hunger Vital Sign    Worried About Running Out of Food in the Last Year: Never true    Ran Out of Food in the Last Year: Never true  Transportation Needs: No Transportation Needs (10/10/2023)   PRAPARE - Administrator, Civil Service (Medical): No    Lack of Transportation (Non-Medical): No  Physical Activity: Not on file  Stress: Not on file  Social Connections: Unknown (10/04/2023)   Received from [facility]   Social Network    Social Network: Not on file  Intimate Partner Violence: Not At Risk (10/10/2023)   Humiliation, Afraid, Rape, and Kick questionnaire    Fear of Current or Ex-Partner: No    Emotionally Abused: No    Physically Abused: No    Sexually Abused: No   Family History  Problem Relation Age of Onset   Breast cancer Mother 34  VITAL SIGNS BP 121/74   Pulse 92   Temp 98.3 F (36.8 C)   Resp 20   Ht 5\' 1"  (1.549 m)   Wt 134 lb 9.6 oz (61.1 kg)   SpO2 98%   BMI 25.43 kg/m   Outpatient Encounter Medications as of 10/28/2023  Medication Sig   atorvastatin (LIPITOR) 10 MG tablet Take 1 tablet by mouth daily.   estradiol (ESTRACE) 0.1 MG/GM vaginal cream Place 1 g vaginally 3 (three) times a week.   Multiple Vitamin (MULTI-VITAMIN) tablet Take 1 tablet by mouth every morning.   polyethylene glycol (MIRALAX / GLYCOLAX) 17 g packet Take 17 g by mouth daily as needed for mild constipation.   [DISCONTINUED] ibuprofen (ADVIL) 200 MG tablet Take 200 mg by mouth every 6 (six) hours as needed for mild pain (pain score  1-3).   No facility-administered encounter medications on file as of 10/28/2023.     SIGNIFICANT DIAGNOSTIC EXAMS  PREVIOUS   10-10-23: wbc 13.8; hgb 17.9; hct 51.5; mcv 87.6 plt 396; glucose 131; bun 43; creat 1.08; k+ 2.4; na++ 135; ca 9.0; gfr 55; protein 7.3 albumin 3.7 urine culture: e-coli: cipro 10-11-23: tsh 3.452 free t4: 1.47 10-14-23: wbc 7.3; hgb 13.0; hct 38.8; mcv 91.3 plt 285; glucose 115; bun 23; creat 0.73; k+ 3.8; na++ 134; ca 8.7; gfr >60  Review of Systems  Reason unable to perform ROS: unable to fully participate.   Physical Exam Constitutional:      General: She is not in acute distress.    Appearance: She is well-developed. She is not diaphoretic.  Neck:     Thyroid: Thyromegaly present.  Cardiovascular:     Rate and Rhythm: Normal rate and regular rhythm.     Pulses: Normal pulses.     Heart sounds: Normal heart sounds.  Pulmonary:     Effort: Pulmonary effort is normal. No respiratory distress.     Breath sounds: Normal breath sounds.  Abdominal:     General: Bowel sounds are normal. There is no distension.     Palpations: Abdomen is soft.     Tenderness: There is no abdominal tenderness.  Musculoskeletal:        General: Normal range of motion.     Cervical back: Neck supple.     Right lower leg: No edema.     Left lower leg: No edema.  Lymphadenopathy:     Cervical: No cervical adenopathy.  Skin:    General: Skin is warm and dry.  Neurological:     Mental Status: She is alert. Mental status is at baseline.     Comments: Has delayed verbal response;  BCAT 10-26-23: 24/50  Psychiatric:        Mood and Affect: Mood normal.    ASSESSMENT/ PLAN:  TODAY  Sepsis secondary to UTI Mood disorder Goiter  Will continue current medications Will continue therapy as directed Will continue to monitor her status. Goals of care: at this time is for assisted living.    Time spent with patient: 40 minutes: therapy; dietary and medications;  medicare and medicaid benefits.    Synthia Innocent NP [facility]   call [phone]

## 2023-10-31 ENCOUNTER — Non-Acute Institutional Stay (SKILLED_NURSING_FACILITY): Payer: Self-pay | Admitting: Student

## 2023-10-31 DIAGNOSIS — R4189 Other symptoms and signs involving cognitive functions and awareness: Secondary | ICD-10-CM | POA: Diagnosis not present

## 2023-10-31 DIAGNOSIS — W19XXXA Unspecified fall, initial encounter: Secondary | ICD-10-CM | POA: Diagnosis not present

## 2023-10-31 DIAGNOSIS — F39 Unspecified mood [affective] disorder: Secondary | ICD-10-CM | POA: Diagnosis not present

## 2023-10-31 NOTE — Progress Notes (Signed)
Location:  [facility]   Place of Service:   Patient's room, 157, [facility]   CODE STATUS: Full Code  Allergies  Allergen Reactions   Penicillins Hives, Rash and Other (See Comments)    Childhood allergy   Penciclovir Rash    Hives   Alendronate Nausea And Vomiting and Nausea Only    GI intolerance    CC: Fall  HPI: Follow-up after a fall that occurred yesterday evening.  She was found by nursing staff on the floor next to her bed at around 8 PM.  She reported to staff at that time that she was trying to get around her bed and fell.  When asked at this morning, she denies that this ever happened in asks to see written documentation that this happened.  She denies any pain related to the event.  She does note a few scabbed over wounds on her lower extremities but tells me that these are old.  She tells me that she feels generally well this morning "just weak" but ate a good breakfast. She tells me that her niece [name] will be coming to see her this week.  She voices a strong desire to transfer to a facility in [place] to be closer to her family.   Past Medical History:  Diagnosis Date   Glucose intolerance    Hyperlipidemia    Hypokalemia 10/10/2023   Rhabdomyolysis 10/10/2023   Sepsis secondary to UTI (HCC) 10/10/2023   Venous stasis     Past Surgical History:  Procedure Laterality Date   AUGMENTATION MAMMAPLASTY Bilateral 2013-14   Mentor, In front of muscle. Bilateral lift    Social History   Socioeconomic History   Marital status: Married    Spouse name: Not on file   Number of children: Not on file   Years of education: Not on file   Highest education level: Not on file  Occupational History   Not on file  Tobacco Use   Smoking status: Never   Smokeless tobacco: Never  Substance and Sexual Activity   Alcohol use: Not on file   Drug use: Never   Sexual activity: Not Currently  Other Topics Concern   Not on file  Social History Narrative   Not  on file   Social Determinants of Health   Financial Resource Strain: Low Risk  (06/21/2023)   Received from [facility]   Overall Financial Resource Strain (CARDIA)    Difficulty of Paying Living Expenses: Not very hard  Food Insecurity: No Food Insecurity (10/10/2023)   Hunger Vital Sign    Worried About Running Out of Food in the Last Year: Never true    Ran Out of Food in the Last Year: Never true  Transportation Needs: No Transportation Needs (10/10/2023)   PRAPARE - Administrator, Civil Service (Medical): No    Lack of Transportation (Non-Medical): No  Physical Activity: Not on file  Stress: Not on file  Social Connections: Unknown (10/04/2023)   Received [facility]   Social Network    Social Network: Not on file  Intimate Partner Violence: Not At Risk (10/10/2023)   Humiliation, Afraid, Rape, and Kick questionnaire    Fear of Current or Ex-Partner: No    Emotionally Abused: No    Physically Abused: No    Sexually Abused: No   Family History  Problem Relation Age of Onset   Breast cancer Mother 2      VITAL SIGNS There were  no vitals taken for this visit.  Outpatient Encounter Medications as of 10/31/2023  Medication Sig   atorvastatin (LIPITOR) 10 MG tablet Take 1 tablet by mouth daily.   estradiol (ESTRACE) 0.1 MG/GM vaginal cream Place 1 g vaginally 3 (three) times a week.   Multiple Vitamin (MULTI-VITAMIN) tablet Take 1 tablet by mouth every morning.   polyethylene glycol (MIRALAX / GLYCOLAX) 17 g packet Take 17 g by mouth daily as needed for mild constipation.   No facility-administered encounter medications on file as of 10/31/2023.     SIGNIFICANT DIAGNOSTIC EXAMS  Gen: Sitting up at bedside having just finished breakfast HENT: MMM, head is atraumatic Cardio: RRR Pulm: Normal WOB on RA, lungs are clear Ext: Bruise over R elbow, overlying medial epicondyle. Scabbed over wounds to the R knee. 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 No warmth, erythema or streaking to suggest infection. The area surrounding the wound is non-tender.  Neuro: Good strength about the LLE, limited strength throughout the RLE, most noticeable weakness with hip flexion. She reports this is at its baseline.    ASSESSMENT/ PLAN:  Fall Not on any thinners. Unfortunately she is not able to tell us much about the fall, in fact denies it entirely. However, it does not appear that she sustained any acute injury with the event. On review of her last PT note, it seems she told PT "I don't need help." I suspect she will remain at increased risk of falls as long as she continues to attempt activity that outpaces her strength/abilities. Her chronic RLE weakness is likely to be a limiting factor here.  - PT to see her for fall follow-up - Will need to discuss her overall care with niece [name] when she comes to visit later in the week, suspect Ms. Jaquez needs ongoing 24 hour supervision as she is at very high risk for future fall events  - Do not see an indication for any imaging at this time     Synthia Innocent NP [facility]  Contact [phone] Monday through Friday 8am- 5pm  After hours call [phone]

## 2023-10-31 NOTE — Assessment & Plan Note (Signed)
Not on any thinners. Unfortunately she is not able to tell us much about the fall, in fact denies it entirely. However, it does not appear that she sustained any acute injury with the event. On review of her last PT note, it seems she told PT "I don't need help." I suspect she will remain at increased risk of falls as long as she continues to attempt activity that outpaces her strength/abilities. Her chronic RLE weakness is likely to be a limiting factor here.  - PT to see her for fall follow-up - Will need to discuss her overall care with niece [name] when she comes to visit later in the week, suspect Ms. Paget needs ongoing 24 hour supervision as she is at very high risk for future fall events  - Do not see an indication for any imaging at this time

## 2023-11-01 ENCOUNTER — Encounter: Payer: Self-pay | Admitting: Student

## 2023-11-01 ENCOUNTER — Encounter (HOSPITAL_COMMUNITY): Payer: Medicare Other

## 2023-11-01 NOTE — Addendum Note (Signed)
Addended by: Sharee Holster on: 11/01/2023 11:03 AM   Modules accepted: Level of Service

## 2023-11-03 ENCOUNTER — Encounter (HOSPITAL_COMMUNITY): Payer: Medicare Other

## 2023-11-03 ENCOUNTER — Encounter (HOSPITAL_COMMUNITY): Payer: Self-pay

## 2023-11-03 ENCOUNTER — Emergency Department (HOSPITAL_COMMUNITY): Payer: Medicare Other

## 2023-11-03 ENCOUNTER — Other Ambulatory Visit: Payer: Self-pay

## 2023-11-03 ENCOUNTER — Emergency Department (HOSPITAL_COMMUNITY)
Admission: EM | Admit: 2023-11-03 | Discharge: 2023-11-04 | Disposition: A | Discharge status: Skilled Nursing Facility | Payer: Medicare Other | Attending: Emergency Medicine | Admitting: Emergency Medicine

## 2023-11-03 DIAGNOSIS — F03918 Unspecified dementia, unspecified severity, with other behavioral disturbance: Secondary | ICD-10-CM | POA: Insufficient documentation

## 2023-11-03 DIAGNOSIS — F22 Delusional disorders: Secondary | ICD-10-CM | POA: Diagnosis not present

## 2023-11-03 DIAGNOSIS — G309 Alzheimer's disease, unspecified: Secondary | ICD-10-CM | POA: Diagnosis not present

## 2023-11-03 DIAGNOSIS — R4689 Other symptoms and signs involving appearance and behavior: Secondary | ICD-10-CM

## 2023-11-03 DIAGNOSIS — F02818 Dementia in other diseases classified elsewhere, unspecified severity, with other behavioral disturbance: Secondary | ICD-10-CM

## 2023-11-03 DIAGNOSIS — D649 Anemia, unspecified: Secondary | ICD-10-CM | POA: Insufficient documentation

## 2023-11-03 HISTORY — DX: Difficulty in walking, not elsewhere classified: R26.2

## 2023-11-03 HISTORY — DX: Unspecified mood (affective) disorder: F39

## 2023-11-03 HISTORY — DX: Nontoxic goiter, unspecified: E04.9

## 2023-11-03 HISTORY — DX: Repeated falls: R29.6

## 2023-11-03 LAB — COMPREHENSIVE METABOLIC PANEL
ALT: 19 U/L (ref 0–44)
AST: 24 U/L (ref 15–41)
Albumin: 3.6 g/dL (ref 3.5–5.0)
Alkaline Phosphatase: 48 U/L (ref 38–126)
Anion gap: 9 (ref 5–15)
BUN: 16 mg/dL (ref 8–23)
CO2: 27 mmol/L (ref 22–32)
Calcium: 9.3 mg/dL (ref 8.9–10.3)
Chloride: 101 mmol/L (ref 98–111)
Creatinine, Ser: 0.6 mg/dL (ref 0.44–1.00)
GFR, Estimated: >60 mL/min (ref >60)
Glucose, Bld: 109 mg/dL — ABNORMAL HIGH (ref 70–99)
Potassium: 3.6 mmol/L (ref 3.5–5.1)
Sodium: 137 mmol/L (ref 135–145)
Total Bilirubin: 0.5 mg/dL (ref <1.2)
Total Protein: 6.6 g/dL (ref 6.5–8.1)

## 2023-11-03 LAB — URINALYSIS, ROUTINE W REFLEX MICROSCOPIC
Bilirubin Urine: NEGATIVE
Glucose, UA: NEGATIVE mg/dL
Hgb urine dipstick: NEGATIVE
Ketones, ur: NEGATIVE mg/dL
Leukocytes,Ua: NEGATIVE
Nitrite: NEGATIVE
Protein, ur: NEGATIVE mg/dL
Specific Gravity, Urine: 1.011 (ref 1.005–1.030)
pH: 6 (ref 5.0–8.0)

## 2023-11-03 LAB — CBC WITH DIFFERENTIAL/PLATELET
Abs Immature Granulocytes: 0.02 10*3/uL (ref 0.00–0.07)
Basophils Absolute: 0.1 10*3/uL (ref 0.0–0.1)
Basophils Relative: 1 %
Eosinophils Absolute: 0.1 10*3/uL (ref 0.0–0.5)
Eosinophils Relative: 2 %
HCT: 34.9 % — ABNORMAL LOW (ref 36.0–46.0)
Hemoglobin: 11.6 g/dL — ABNORMAL LOW (ref 12.0–15.0)
Immature Granulocytes: 0 %
Lymphocytes Relative: 39 %
Lymphs Abs: 2.1 10*3/uL (ref 0.7–4.0)
MCH: 30.9 pg (ref 26.0–34.0)
MCHC: 33.2 g/dL (ref 30.0–36.0)
MCV: 93.1 fL (ref 80.0–100.0)
Monocytes Absolute: 0.7 10*3/uL (ref 0.1–1.0)
Monocytes Relative: 13 %
Neutro Abs: 2.4 10*3/uL (ref 1.7–7.7)
Neutrophils Relative %: 45 %
Platelets: 348 10*3/uL (ref 150–400)
RBC: 3.75 MIL/uL — ABNORMAL LOW (ref 3.87–5.11)
RDW: 13.9 % (ref 11.5–15.5)
WBC: 5.3 10*3/uL (ref 4.0–10.5)
nRBC: 0 % (ref 0.0–0.2)

## 2023-11-03 MED ORDER — IBUPROFEN 400 MG PO TABS
600.0000 mg | ORAL_TABLET | Freq: Four times a day (QID) | ORAL | Status: DC | PRN
  Administered 2023-11-03: 600 mg via ORAL
  Filled 2023-11-03: qty 2

## 2023-11-03 NOTE — ED Notes (Addendum)
Pt R knee is very swollen and has abrasion, pt reports that she fell the other day, pt unable to bear weight on that leg due to pain. Ice pack given, MD made aware. Pt denies SI/HI/AVH, pt AXO x 2, disoriented to place and situation, states that she is in jail, reoriented patient. Pt calm and pleasant

## 2023-11-03 NOTE — ED Notes (Signed)
1645 ua given to lab tech to take to lab

## 2023-11-03 NOTE — ED Notes (Signed)
Pt taken to restroom via wheelchair by tech to get a urine sample.

## 2023-11-03 NOTE — ED Provider Notes (Signed)
Pt was complaining of right knee pain, so I did an xray.  This was reviewed and I agree with the radiologist.  R knee: Improving anterior soft tissue edema. No acute osseous abnormality.     Jacalyn Lefevre, MD 11/03/23 [id number]

## 2023-11-03 NOTE — ED Notes (Signed)
Dietary notified about tray.

## 2023-11-03 NOTE — ED Notes (Signed)
Pt niece called for an update, she is listed under patients contacts.
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
## 2023-11-03 NOTE — ED Notes (Signed)
Faxed IVC paperwork to [phone].
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
## 2023-11-03 NOTE — ED Notes (Signed)
TTS machine placed at bedside.   

## 2023-11-03 NOTE — ED Triage Notes (Signed)
Pt arrived [facility] with [place] [employer] with IVC paperwork. [facility] stated pt has been calling 911, being aggressive to staff, and locked herself in the bathroom.  CN noted in paperwork that came from [facility] , Pt has been calling 911 since 10/23/23 and pt has locked herself in the bathroom on 10/30/23. Diagnosis per paperwork shows UTI and mood disorder

## 2023-11-04 MED ORDER — DIVALPROEX SODIUM 125 MG PO DR TAB: 125.0000 mg | DELAYED_RELEASE_TABLET | Freq: Three times a day (TID) | ORAL | 0 refills | Status: DC

## 2023-11-04 NOTE — ED Provider Notes (Signed)
Patient has been seen and evaluated by behavioral health who feels as though patient does not meet inpatient criteria and can be returned to her extended care facility.  Recommendations for Depakote were made and will be followed.  I have prescribed this medication for her.  To follow-up with primary doctor.   Geoffery Lyons, MD 11/04/23 [phone]

## 2023-11-04 NOTE — Discharge Instructions (Addendum)
Begin taking Depakote as prescribed.  Follow-up with primary doctor in the next week.

## 2023-11-07 ENCOUNTER — Non-Acute Institutional Stay (SKILLED_NURSING_FACILITY): Payer: Self-pay | Admitting: Adult Health

## 2023-11-07 ENCOUNTER — Encounter: Payer: Self-pay | Admitting: Adult Health

## 2023-11-07 DIAGNOSIS — F39 Unspecified mood [affective] disorder: Secondary | ICD-10-CM | POA: Diagnosis not present

## 2023-11-07 DIAGNOSIS — R4189 Other symptoms and signs involving cognitive functions and awareness: Secondary | ICD-10-CM | POA: Diagnosis not present

## 2023-11-07 DIAGNOSIS — F01518 Vascular dementia, unspecified severity, with other behavioral disturbance: Secondary | ICD-10-CM
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
## 2023-11-08 ENCOUNTER — Encounter (HOSPITAL_COMMUNITY): Payer: Medicare Other | Admitting: Physical Therapy

## 2023-11-08 DIAGNOSIS — F01518 Vascular dementia, unspecified severity, with other behavioral disturbance: Secondary | ICD-10-CM | POA: Insufficient documentation

## 2023-11-10 ENCOUNTER — Encounter (HOSPITAL_COMMUNITY): Payer: Medicare Other

## 2023-11-14 ENCOUNTER — Encounter: Payer: Self-pay | Admitting: Adult Health

## 2023-11-14 ENCOUNTER — Non-Acute Institutional Stay (SKILLED_NURSING_FACILITY): Payer: Medicare Other | Admitting: Adult Health

## 2023-11-14 DIAGNOSIS — J309 Allergic rhinitis, unspecified: Secondary | ICD-10-CM | POA: Diagnosis not present

## 2023-11-14 NOTE — Progress Notes (Addendum)
Location:  [facility] Room Number: 157 Place of Service:  SNF (31)   CODE STATUS: full   Allergies  Allergen Reactions   Penicillins Hives, Rash and Other (See Comments)    Childhood allergy   Penciclovir Rash    Hives   Alendronate Nausea And Vomiting and Nausea Only    GI intolerance    Chief Complaint  Patient presents with   Acute Visit    Sinus congestion     HPI:  She has started to have sinus congestion yesterday. She has a sore throat; cough with Braylea Brancato tinged sputum and nasal congestion. She denies any chest pain or shortness of breath. There are no reports of fevers present.   Past Medical History:  Diagnosis Date   Difficulty in walking    per [facility]   Falls    MAR   Glucose intolerance    Goiter, non-toxic    per [facility]   Hyperlipidemia    Hypokalemia 10/10/2023   Mood disorder (HCC)    per [facility] date 10/28/23   Rhabdomyolysis 10/10/2023   Sepsis secondary to UTI (HCC) 10/10/2023   Venous stasis     Past Surgical History:  Procedure Laterality Date   AUGMENTATION MAMMAPLASTY Bilateral 2013-14   Mentor, In front of muscle. Bilateral lift    Social History   Socioeconomic History   Marital status: Married    Spouse name: Not on file   Number of children: Not on file   Years of education: Not on file   Highest education level: Not on file  Occupational History   Not on file  Tobacco Use   Smoking status: Never   Smokeless tobacco: Never  Substance and Sexual Activity   Alcohol use: Not Currently   Drug use: Never   Sexual activity: Not Currently  Other Topics Concern   Not on file  Social History Narrative   Not on file   Social Determinants of Health   Financial Resource Strain: Low Risk  (06/21/2023)   Received from [facility]   Overall Financial Resource Strain (CARDIA)    Difficulty of Paying Living Expenses: Not very hard  Food Insecurity: No Food Insecurity (10/10/2023)    Hunger Vital Sign    Worried About Running Out of Food in the Last Year: Never true    Ran Out of Food in the Last Year: Never true  Transportation Needs: No Transportation Needs (10/10/2023)   PRAPARE - Administrator, Civil Service (Medical): No    Lack of Transportation (Non-Medical): No  Physical Activity: Not on file  Stress: Not on file  Social Connections: Unknown (10/04/2023)   Received from [facility]   Social Network    Social Network: Not on file  Intimate Partner Violence: Not At Risk (10/10/2023)   Humiliation, Afraid, Rape, and Kick questionnaire    Fear of Current or Ex-Partner: No    Emotionally Abused: No    Physically Abused: No    Sexually Abused: No   Family History  Problem Relation Age of Onset   Breast cancer Mother 53      VITAL SIGNS BP 119/69   Pulse 89   Temp (!) 97.1 F (36.2 C)   Resp 20   Ht 5\' 1"  (1.549 m)   Wt 136 lb 9.6 oz (62 kg)   SpO2 94%   BMI 25.81 kg/m   Outpatient Encounter Medications as of 11/14/2023  Medication Sig  atorvastatin (LIPITOR) 10 MG tablet Take 1 tablet by mouth daily.   divalproex (DEPAKOTE) 125 MG DR tablet Take 1 tablet (125 mg total) by mouth 3 (three) times daily.   estradiol (ESTRACE) 0.1 MG/GM vaginal cream Place 1 g vaginally 3 (three) times a week.   Multiple Vitamin (MULTI-VITAMIN) tablet Take 1 tablet by mouth every morning.   polyethylene glycol (MIRALAX / GLYCOLAX) 17 g packet Take 17 g by mouth daily as needed for mild constipation.   No facility-administered encounter medications on file as of 11/14/2023.     SIGNIFICANT DIAGNOSTIC EXAMS  PREVIOUS   10-10-23: wbc 13.8; hgb 17.9; hct 51.5; mcv 87.6 plt 396; glucose 131; bun 43; creat 1.08; k+ 2.4; na++ 135; ca 9.0; gfr 55; protein 7.3 albumin 3.7 urine culture: e-coli: cipro 10-11-23: tsh 3.452 free t4: 1.47 10-14-23: wbc 7.3; hgb 13.0; hct 38.8; mcv 91.3 plt 285; glucose 115; bun 23; creat 0.73; k+ 3.8; na++ 134; ca 8.7; gfr  >60 11-03-23: wbc 5.3; hgb 11.6; hct 34.9; mcv 93.1 plt 348; glucose 109; bun 16; creat 0.60; k+ 3.6; na++ 137; ca 9.3 gfr >60; protein 6.6 albumin 3.6   NO NEW LABS.   Review of Systems  Constitutional:  Negative for malaise/fatigue.  HENT:  Positive for congestion, sinus pain and sore throat. Negative for ear pain.   Eyes:  Negative for discharge.  Respiratory:  Positive for cough and sputum production. Negative for shortness of breath.   Cardiovascular:  Negative for chest pain, palpitations and leg swelling.  Gastrointestinal:  Negative for abdominal pain, constipation and heartburn.  Musculoskeletal:  Negative for back pain, joint pain and myalgias.  Skin: Negative.   Neurological:  Negative for dizziness.  Psychiatric/Behavioral:  The patient is not nervous/anxious.     Physical Exam Constitutional:      General: She is not in acute distress.    Appearance: She is well-developed. She is not diaphoretic.  HENT:     Right Ear: Tympanic membrane normal.     Left Ear: Tympanic membrane normal.     Nose: Nose normal.     Mouth/Throat:     Mouth: Mucous membranes are moist.     Comments: Erythema present  Eyes:     Conjunctiva/sclera: Conjunctivae normal.  Neck:     Thyroid: No thyromegaly.  Cardiovascular:     Rate and Rhythm: Normal rate and regular rhythm.     Pulses: Normal pulses.     Heart sounds: Murmur heard.  Pulmonary:     Effort: Pulmonary effort is normal. No respiratory distress.     Breath sounds: Normal breath sounds.  Abdominal:     General: Bowel sounds are normal. There is no distension.     Palpations: Abdomen is soft.     Tenderness: There is no abdominal tenderness.  Musculoskeletal:        General: Normal range of motion.     Cervical back: Neck supple.     Right lower leg: No edema.     Left lower leg: No edema.  Lymphadenopathy:     Cervical: No cervical adenopathy.  Skin:    General: Skin is warm and dry.  Neurological:     Mental Status:  She is alert. Mental status is at baseline.     Comments: Has delayed verbal response;  BCAT 10-26-23: 24/50   Psychiatric:        Mood and Affect: Mood normal.     ASSESSMENT/ PLAN:  TODAY  Acute allergic rhinitis:  more than likely this is viral in nature. Will treat symptoms with mucinex 600 mg twice daily through 11-18-23; and flonase nightly for one bottle. If she does not improve over the next week; or becomes worse; will further treat as indicated.    Synthia Innocent NP [facility]  call [phone]

## 2023-11-15 ENCOUNTER — Encounter (HOSPITAL_COMMUNITY): Payer: Medicare Other

## 2023-11-17 ENCOUNTER — Encounter: Payer: Self-pay | Admitting: Adult Health

## 2023-11-17 ENCOUNTER — Encounter (HOSPITAL_COMMUNITY): Payer: Medicare Other

## 2023-11-17 ENCOUNTER — Non-Acute Institutional Stay (SKILLED_NURSING_FACILITY): Payer: Medicare Other | Admitting: Adult Health

## 2023-11-17 DIAGNOSIS — F01518 Vascular dementia, unspecified severity, with other behavioral disturbance: Secondary | ICD-10-CM | POA: Diagnosis not present

## 2023-11-17 DIAGNOSIS — E782 Mixed hyperlipidemia: Secondary | ICD-10-CM

## 2023-11-17 DIAGNOSIS — F39 Unspecified mood [affective] disorder: Secondary | ICD-10-CM | POA: Diagnosis not present

## 2023-11-17 NOTE — Progress Notes (Signed)
Location:  [facility] Room Number: 157-W Place of Service:  SNF (31)   CODE STATUS: Full Code  Allergies  Allergen Reactions   Penicillins Hives, Rash and Other (See Comments)    Childhood allergy   Penciclovir Rash    Hives   Alendronate Nausea And Vomiting and Nausea Only    GI intolerance    Chief Complaint  Patient presents with   Medical Management of Chronic Issues            Vascular dementia with behavioral disturbance    mood disorder:  Mixed hyperlipidemia:     HPI:  She is a 73 year old resident of this facility being seen for the management of her chronic illnesses: Vascular dementia with behavioral disturbance    mood disorder:  Mixed hyperlipidemia. Since beginning the depakote her mood state has improved. She has been declining therapy frequently.   Past Medical History:  Diagnosis Date   Difficulty in walking    per [facility]   Falls    MAR   Glucose intolerance    Goiter, non-toxic    per [facility]   Hyperlipidemia    Hypokalemia 10/10/2023   Mood disorder (HCC)    per [facility] date 10/28/23   Rhabdomyolysis 10/10/2023   Sepsis secondary to UTI (HCC) 10/10/2023   Venous stasis     Past Surgical History:  Procedure Laterality Date   AUGMENTATION MAMMAPLASTY Bilateral 2013-14   Mentor, In front of muscle. Bilateral lift    Social History   Socioeconomic History   Marital status: Married    Spouse name: Not on file   Number of children: Not on file   Years of education: Not on file   Highest education level: Not on file  Occupational History   Not on file  Tobacco Use   Smoking status: Never   Smokeless tobacco: Never  Substance and Sexual Activity   Alcohol use: Not Currently   Drug use: Never   Sexual activity: Not Currently  Other Topics Concern   Not on file  Social History Narrative   Not on file   Social Determinants of Health   Financial Resource Strain: Low Risk  (06/21/2023)   Received  from [facility]   Overall Financial Resource Strain (CARDIA)    Difficulty of Paying Living Expenses: Not very hard  Food Insecurity: No Food Insecurity (10/10/2023)   Hunger Vital Sign    Worried About Running Out of Food in the Last Year: Never true    Ran Out of Food in the Last Year: Never true  Transportation Needs: No Transportation Needs (10/10/2023)   PRAPARE - Administrator, Civil Service (Medical): No    Lack of Transportation (Non-Medical): No  Physical Activity: Not on file  Stress: Not on file  Social Connections: Unknown (10/04/2023)   Received from [facility]   Social Network    Social Network: Not on file  Intimate Partner Violence: Not At Risk (10/10/2023)   Humiliation, Afraid, Rape, and Kick questionnaire    Fear of Current or Ex-Partner: No    Emotionally Abused: No    Physically Abused: No    Sexually Abused: No   Family History  Problem Relation Age of Onset   Breast cancer Mother 89      VITAL SIGNS BP 116/69   Pulse 89   Temp (!) 97.1 F (36.2 C)   Resp 20   Ht 5\' 1"  (1.549  m)   Wt 136 lb 9.6 oz (62 kg)   SpO2 94%   BMI 25.81 kg/m   Outpatient Encounter Medications as of 11/17/2023  Medication Sig   acetaminophen (TYLENOL) 325 MG tablet Take 650 mg by mouth every 6 (six) hours as needed.   atorvastatin (LIPITOR) 10 MG tablet Take 1 tablet by mouth daily.   divalproex (DEPAKOTE) 125 MG DR tablet Take 1 tablet (125 mg total) by mouth 3 (three) times daily.   estradiol (ESTRACE) 0.1 MG/GM vaginal cream Place 1 g vaginally 3 (three) times a week.   fluticasone (FLONASE) 50 MCG/ACT nasal spray Place 2 sprays into both nostrils daily.   guaiFENesin (MUCINEX) 600 MG 12 hr tablet Take 600 mg by mouth 2 (two) times daily.   Multiple Vitamin (MULTI-VITAMIN) tablet Take 1 tablet by mouth every morning.   polyethylene glycol (MIRALAX / GLYCOLAX) 17 g packet Take 17 g by mouth daily as needed for mild constipation.   No  facility-administered encounter medications on file as of 11/17/2023.     SIGNIFICANT DIAGNOSTIC EXAMS  PREVIOUS   10-10-23: wbc 13.8; hgb 17.9; hct 51.5; mcv 87.6 plt 396; glucose 131; bun 43; creat 1.08; k+ 2.4; na++ 135; ca 9.0; gfr 55; protein 7.3 albumin 3.7 urine culture: e-coli: cipro 10-11-23: tsh 3.452 free t4: 1.47 10-14-23: wbc 7.3; hgb 13.0; hct 38.8; mcv 91.3 plt 285; glucose 115; bun 23; creat 0.73; k+ 3.8; na++ 134; ca 8.7; gfr >60 11-03-23: wbc 5.3; hgb 11.6; hct 34.9; mcv 93.1 plt 348; glucose 109; bun 16; creat 0.60; k+ 3.6; na++ 137; ca 9.3 gfr >60; protein 6.6 albumin 3.6   NO NEW LABS.   Review of Systems  Constitutional:  Negative for malaise/fatigue.  Respiratory:  Negative for cough and shortness of breath.   Cardiovascular:  Negative for chest pain, palpitations and leg swelling.  Gastrointestinal:  Negative for abdominal pain, constipation and heartburn.  Musculoskeletal:  Negative for back pain, joint pain and myalgias.  Skin: Negative.   Neurological:  Negative for dizziness.  Psychiatric/Behavioral:  The patient is not nervous/anxious.     Physical Exam Constitutional:      General: She is not in acute distress.    Appearance: She is well-developed. She is not diaphoretic.  Neck:     Thyroid: No thyromegaly.  Cardiovascular:     Rate and Rhythm: Normal rate and regular rhythm.     Pulses: Normal pulses.     Heart sounds: Murmur heard.  Pulmonary:     Effort: Pulmonary effort is normal. No respiratory distress.     Breath sounds: Normal breath sounds.  Abdominal:     General: Bowel sounds are normal. There is no distension.     Palpations: Abdomen is soft.     Tenderness: There is no abdominal tenderness.  Musculoskeletal:        General: Normal range of motion.     Cervical back: Neck supple.     Right lower leg: No edema.     Left lower leg: No edema.  Lymphadenopathy:     Cervical: No cervical adenopathy.  Skin:    General: Skin is warm  and dry.  Neurological:     Mental Status: She is alert. Mental status is at baseline.  Psychiatric:        Mood and Affect: Mood normal.     ASSESSMENT/ PLAN:  TODAY  Vascular dementia with behavioral disturbance / mood disorder: her weight is 136 pounds; will continue depakote 125  mg three times daily to help stabilize mood.   2. Mixed hyperlipidemia: cholesterol 235; ldl 127: will continue lipitor 10 mg daily   PREVIOUS   3. Glucose intolerance: hgb A1c 5.5  4. Vaginal atrophy uses estrace 1 gm three times weekly   5. Goiter: mildly elevated free t4: 1.47 will monitor    Synthia Innocent NP [facility]  call [phone]

## 2023-11-18 ENCOUNTER — Encounter: Payer: Self-pay | Admitting: Adult Health

## 2023-11-18 ENCOUNTER — Non-Acute Institutional Stay (SKILLED_NURSING_FACILITY): Payer: Self-pay | Admitting: Adult Health

## 2023-11-18 DIAGNOSIS — F39 Unspecified mood [affective] disorder: Secondary | ICD-10-CM | POA: Diagnosis not present

## 2023-11-18 DIAGNOSIS — F01518 Vascular dementia, unspecified severity, with other behavioral disturbance: Secondary | ICD-10-CM | POA: Diagnosis not present

## 2023-11-18 DIAGNOSIS — E782 Mixed hyperlipidemia: Secondary | ICD-10-CM | POA: Diagnosis not present

## 2023-11-18 NOTE — Progress Notes (Signed)
Location:  [facility] Room Number: 157 Place of Service:  SNF (31)   CODE STATUS: full   Allergies  Allergen Reactions   Penicillins Hives, Rash and Other (See Comments)    Childhood allergy   Penciclovir Rash    Hives   Alendronate Nausea And Vomiting and Nausea Only    GI intolerance    Chief Complaint  Patient presents with   Acute Visit    Care plan meeting     HPI:  We have come together for her care plan meeting. Family present  BIMS 7/15; BCAT 24/50. She uses wheelchair and has 3 falls without injury. She requires moderate to max assist for her adl needs. She is frequently incontinent of bladder and bowel. Dietary: requires setup regular diet weight is 136.6 pounds; appetite variable. Therapy: in the past 2 weeks she has declined 6 treatments. She is declining medications; she has cut off her wander guard. She has called the police 3 times. She will cuss at staff and does hit at staff at times.  We have discussed her advanced directives; her MOST form has been filled out.   She will continue to be followed for her chronic illnesses including:  Vascular dementia with behavioral disturbance  Mood disorder  Mixed hyperlipidemia  Past Medical History:  Diagnosis Date   Difficulty in walking    per [facility]   Falls    MAR   Glucose intolerance    Goiter, non-toxic    per [facility]   Hyperlipidemia    Hypokalemia 10/10/2023   Mood disorder (HCC)    per [facility] date 10/28/23   Rhabdomyolysis 10/10/2023   Sepsis secondary to UTI (HCC) 10/10/2023   Venous stasis     Past Surgical History:  Procedure Laterality Date   AUGMENTATION MAMMAPLASTY Bilateral 2013-14   Mentor, In front of muscle. Bilateral lift    Social History   Socioeconomic History   Marital status: Married    Spouse name: Not on file   Number of children: Not on file   Years of education: Not on file   Highest education level: Not on file  Occupational  History   Not on file  Tobacco Use   Smoking status: Never   Smokeless tobacco: Never  Substance and Sexual Activity   Alcohol use: Not Currently   Drug use: Never   Sexual activity: Not Currently  Other Topics Concern   Not on file  Social History Narrative   Not on file   Social Determinants of Health   Financial Resource Strain: Low Risk  (06/21/2023)   Received from [facility]   Overall Financial Resource Strain (CARDIA)    Difficulty of Paying Living Expenses: Not very hard  Food Insecurity: No Food Insecurity (10/10/2023)   Hunger Vital Sign    Worried About Running Out of Food in the Last Year: Never true    Ran Out of Food in the Last Year: Never true  Transportation Needs: No Transportation Needs (10/10/2023)   PRAPARE - Administrator, Civil Service (Medical): No    Lack of Transportation (Non-Medical): No  Physical Activity: Not on file  Stress: Not on file  Social Connections: Unknown (10/04/2023)   Received from [facility]   Social Network    Social Network: Not on file  Intimate Partner Violence: Not At Risk (10/10/2023)   Humiliation, Afraid, Rape, and Kick questionnaire    Fear of Current or Ex-Partner:  No    Emotionally Abused: No    Physically Abused: No    Sexually Abused: No   Family History  Problem Relation Age of Onset   Breast cancer Mother 61      VITAL SIGNS BP 122/76   Pulse 80   Temp (!) 97.3 F (36.3 C)   Resp 20   Ht 5\' 1"  (1.549 m)   Wt 136 lb 9.6 oz (62 kg)   SpO2 94%   BMI 25.81 kg/m   Outpatient Encounter Medications as of 11/18/2023  Medication Sig   acetaminophen (TYLENOL) 325 MG tablet Take 650 mg by mouth every 6 (six) hours as needed.   atorvastatin (LIPITOR) 10 MG tablet Take 1 tablet by mouth daily.   divalproex (DEPAKOTE) 125 MG DR tablet Take 1 tablet (125 mg total) by mouth 3 (three) times daily.   estradiol (ESTRACE) 0.1 MG/GM vaginal cream Place 1 g vaginally 3 (three) times a week.    fluticasone (FLONASE) 50 MCG/ACT nasal spray Place 2 sprays into both nostrils daily.   guaiFENesin (MUCINEX) 600 MG 12 hr tablet Take 600 mg by mouth 2 (two) times daily.   Multiple Vitamin (MULTI-VITAMIN) tablet Take 1 tablet by mouth every morning.   polyethylene glycol (MIRALAX / GLYCOLAX) 17 g packet Take 17 g by mouth daily as needed for mild constipation.   No facility-administered encounter medications on file as of 11/18/2023.     SIGNIFICANT DIAGNOSTIC EXAMS  PREVIOUS   10-10-23: wbc 13.8; hgb 17.9; hct 51.5; mcv 87.6 plt 396; glucose 131; bun 43; creat 1.08; k+ 2.4; na++ 135; ca 9.0; gfr 55; protein 7.3 albumin 3.7 urine culture: e-coli: cipro 10-11-23: tsh 3.452 free t4: 1.47 10-14-23: wbc 7.3; hgb 13.0; hct 38.8; mcv 91.3 plt 285; glucose 115; bun 23; creat 0.73; k+ 3.8; na++ 134; ca 8.7; gfr >60 11-03-23: wbc 5.3; hgb 11.6; hct 34.9; mcv 93.1 plt 348; glucose 109; bun 16; creat 0.60; k+ 3.6; na++ 137; ca 9.3 gfr >60; protein 6.6 albumin 3.6   NO NEW LABS.   Review of Systems  Constitutional:  Negative for malaise/fatigue.  Respiratory:  Negative for cough and shortness of breath.   Cardiovascular:  Negative for chest pain, palpitations and leg swelling.  Gastrointestinal:  Negative for abdominal pain, constipation and heartburn.  Musculoskeletal:  Negative for back pain, joint pain and myalgias.  Skin: Negative.   Neurological:  Negative for dizziness.  Psychiatric/Behavioral:  The patient is not nervous/anxious.     Physical Exam Constitutional:      General: She is not in acute distress.    Appearance: She is well-developed. She is not diaphoretic.  Neck:     Thyroid: No thyromegaly.  Cardiovascular:     Rate and Rhythm: Normal rate and regular rhythm.     Pulses: Normal pulses.     Heart sounds: Murmur heard.  Pulmonary:     Effort: Pulmonary effort is normal. No respiratory distress.     Breath sounds: Normal breath sounds.  Abdominal:     General: Bowel  sounds are normal. There is no distension.     Palpations: Abdomen is soft.     Tenderness: There is no abdominal tenderness.  Musculoskeletal:        General: Normal range of motion.     Cervical back: Neck supple.     Right lower leg: No edema.     Left lower leg: No edema.  Lymphadenopathy:     Cervical: No cervical adenopathy.  Skin:    General: Skin is warm and dry.  Neurological:     Mental Status: She is alert. Mental status is at baseline.     Comments: Has delayed verbal response;  BCAT 10-26-23: 24/50  Psychiatric:        Mood and Affect: Mood normal.      ASSESSMENT/ PLAN:  TODAY  Vascular dementia with behavioral disturbance Mood disorder Mixed hyperlipidemia  Will stop lipitor MVI; estradiol; mucinex; flonase; she is unwilling to take medications.  Will continue therapy as directed Will monitor her status.  Goals of care: memory care unit  Time spent with patient: 40 minutes: goals of care; medications; therapy. (20 minutes spent with advanced directives MOST form filled out)    Synthia Innocent NP [facility]   call [phone]

## 2023-11-21 ENCOUNTER — Other Ambulatory Visit (HOSPITAL_COMMUNITY)
Admission: RE | Admit: 2023-11-21 | Discharge: 2023-11-21 | Disposition: A | Discharge status: Home / Self Care | Payer: Medicare Other | Source: Skilled Nursing Facility | Attending: Adult Health | Admitting: Adult Health

## 2023-11-21 ENCOUNTER — Encounter (HOSPITAL_COMMUNITY): Payer: Medicare Other

## 2023-11-21 DIAGNOSIS — F39 Unspecified mood [affective] disorder: Secondary | ICD-10-CM | POA: Insufficient documentation

## 2023-11-21 LAB — CBC
HCT: 35.4 % — ABNORMAL LOW (ref 36.0–46.0)
Hemoglobin: 11.8 g/dL — ABNORMAL LOW (ref 12.0–15.0)
MCH: 31 pg (ref 26.0–34.0)
MCHC: 33.3 g/dL (ref 30.0–36.0)
MCV: 92.9 fL (ref 80.0–100.0)
Platelets: 390 10*3/uL (ref 150–400)
RBC: 3.81 MIL/uL — ABNORMAL LOW (ref 3.87–5.11)
RDW: 13.6 % (ref 11.5–15.5)
WBC: 5.5 10*3/uL (ref 4.0–10.5)
nRBC: 0 % (ref 0.0–0.2)

## 2023-11-21 LAB — COMPREHENSIVE METABOLIC PANEL
ALT: 13 U/L (ref 0–44)
AST: 20 U/L (ref 15–41)
Albumin: 3.2 g/dL — ABNORMAL LOW (ref 3.5–5.0)
Alkaline Phosphatase: 44 U/L (ref 38–126)
Anion gap: 9 (ref 5–15)
BUN: 13 mg/dL (ref 8–23)
CO2: 26 mmol/L (ref 22–32)
Calcium: 8.6 mg/dL — ABNORMAL LOW (ref 8.9–10.3)
Chloride: 99 mmol/L (ref 98–111)
Creatinine, Ser: 0.5 mg/dL (ref 0.44–1.00)
GFR, Estimated: >60 mL/min (ref >60)
Glucose, Bld: 96 mg/dL (ref 70–99)
Potassium: 3.4 mmol/L — ABNORMAL LOW (ref 3.5–5.1)
Sodium: 134 mmol/L — ABNORMAL LOW (ref 135–145)
Total Bilirubin: 0.5 mg/dL (ref <1.2)
Total Protein: 6.3 g/dL — ABNORMAL LOW (ref 6.5–8.1)

## 2023-11-21 LAB — VALPROIC ACID LEVEL: Valproic Acid Lvl: 51 ug/mL (ref 50.0–100.0)

## 2023-11-23 ENCOUNTER — Encounter (HOSPITAL_COMMUNITY): Payer: Medicare Other

## 2023-12-22 ENCOUNTER — Encounter: Payer: Self-pay | Admitting: Adult Health

## 2023-12-22 ENCOUNTER — Non-Acute Institutional Stay (SKILLED_NURSING_FACILITY): Payer: Self-pay | Admitting: Adult Health

## 2023-12-22 DIAGNOSIS — N952 Postmenopausal atrophic vaginitis: Secondary | ICD-10-CM

## 2023-12-22 DIAGNOSIS — E7439 Other disorders of intestinal carbohydrate absorption: Secondary | ICD-10-CM

## 2023-12-22 DIAGNOSIS — E049 Nontoxic goiter, unspecified: Secondary | ICD-10-CM | POA: Diagnosis not present

## 2023-12-22 NOTE — Progress Notes (Signed)
Location:  [facility] Room Number: 157 Place of Service:  SNF (31)   CODE STATUS: dnr   Allergies  Allergen Reactions   Penicillins Hives, Rash and Other (See Comments)    Childhood allergy   Penciclovir Rash    Hives   Alendronate Nausea And Vomiting and Nausea Only    GI intolerance    Chief Complaint  Patient presents with   Medical Management of Chronic Issues          Glucose intolerance: Vaginal atrophy:  Goiter:      HPI:  She is a 72 year old long term resident of this facility being seen for the management of her chronic illnesses: Glucose intolerance: Vaginal atrophy:  Goiter. She is having fewer behavioral issues since starting on the depakote. There are no reports of uncontrolled pain. Her weight is stable at 136 pounds.   Past Medical History:  Diagnosis Date   Difficulty in walking    per [facility]   Falls    MAR   Glucose intolerance    Goiter, non-toxic    per [facility]   Hyperlipidemia    Hypokalemia 10/10/2023   Mood disorder (HCC)    [facility] date 10/28/23   Rhabdomyolysis 10/10/2023   Sepsis secondary to UTI (HCC) 10/10/2023   Venous stasis     Past Surgical History:  Procedure Laterality Date   AUGMENTATION MAMMAPLASTY Bilateral 2013-14   Mentor, In front of muscle. Bilateral lift    Social History   Socioeconomic History   Marital status: Married    Spouse name: Not on file   Number of children: Not on file   Years of education: Not on file   Highest education level: Not on file  Occupational History   Not on file  Tobacco Use   Smoking status: Never   Smokeless tobacco: Never  Substance and Sexual Activity   Alcohol use: Not Currently   Drug use: Never   Sexual activity: Not Currently  Other Topics Concern   Not on file  Social History Narrative   Not on file   Social Drivers of Health   Financial Resource Strain: Low Risk  (06/21/2023)   Received from [facility]   Overall  Financial Resource Strain (CARDIA)    Difficulty of Paying Living Expenses: Not very hard  Food Insecurity: No Food Insecurity (10/10/2023)   Hunger Vital Sign    Worried About Running Out of Food in the Last Year: Never true    Ran Out of Food in the Last Year: Never true  Transportation Needs: No Transportation Needs (10/10/2023)   PRAPARE - Administrator, Civil Service (Medical): No    Lack of Transportation (Non-Medical): No  Physical Activity: Not on file  Stress: Not on file  Social Connections: Unknown (10/04/2023)   Received from [facility]   Social Network    Social Network: Not on file  Intimate Partner Violence: Not At Risk (10/10/2023)   Humiliation, Afraid, Rape, and Kick questionnaire    Fear of Current or Ex-Partner: No    Emotionally Abused: No    Physically Abused: No    Sexually Abused: No   Family History  Problem Relation Age of Onset   Breast cancer Mother 68      VITAL SIGNS BP (!) 140/80   Pulse 78   Temp (!) 97.5 F (36.4 C)   Resp 18   Ht 5\' 1"  (1.549 m)  Wt 136 lb (61.7 kg)   SpO2 97%   BMI 25.70 kg/m   Outpatient Encounter Medications as of 12/22/2023  Medication Sig   acetaminophen (TYLENOL) 325 MG tablet Take 650 mg by mouth every 6 (six) hours as needed.   divalproex (DEPAKOTE) 125 MG DR tablet Take 1 tablet (125 mg total) by mouth 3 (three) times daily.   fluticasone (FLONASE) 50 MCG/ACT nasal spray Place 2 sprays into both nostrils daily.   polyethylene glycol (MIRALAX / GLYCOLAX) 17 g packet Take 17 g by mouth daily as needed for mild constipation.   No facility-administered encounter medications on file as of 12/22/2023.     SIGNIFICANT DIAGNOSTIC EXAMS  PREVIOUS   10-10-23: wbc 13.8; hgb 17.9; hct 51.5; mcv 87.6 plt 396; glucose 131; bun 43; creat 1.08; k+ 2.4; na++ 135; ca 9.0; gfr 55; protein 7.3 albumin 3.7 urine culture: e-coli: cipro 10-11-23: tsh 3.452 free t4: 1.47 10-14-23: wbc 7.3; hgb 13.0; hct  38.8; mcv 91.3 plt 285; glucose 115; bun 23; creat 0.73; k+ 3.8; na++ 134; ca 8.7; gfr >60 11-03-23: wbc 5.3; hgb 11.6; hct 34.9; mcv 93.1 plt 348; glucose 109; bun 16; creat 0.60; k+ 3.6; na++ 137; ca 9.3 gfr >60; protein 6.6 albumin 3.6   NO NEW LABS.   Review of Systems  Constitutional:  Negative for malaise/fatigue.  Respiratory:  Negative for cough and shortness of breath.   Cardiovascular:  Negative for chest pain, palpitations and leg swelling.  Gastrointestinal:  Negative for abdominal pain, constipation and heartburn.  Musculoskeletal:  Negative for back pain, joint pain and myalgias.  Skin: Negative.   Neurological:  Negative for dizziness.  Psychiatric/Behavioral:  The patient is not nervous/anxious.    Physical Exam Constitutional:      General: She is not in acute distress.    Appearance: She is well-developed. She is not diaphoretic.  Neck:     Thyroid: No thyromegaly.  Cardiovascular:     Rate and Rhythm: Normal rate and regular rhythm.     Pulses: Normal pulses.     Heart sounds: Murmur heard.  Pulmonary:     Effort: Pulmonary effort is normal. No respiratory distress.     Breath sounds: Normal breath sounds.  Abdominal:     General: Bowel sounds are normal. There is no distension.     Palpations: Abdomen is soft.     Tenderness: There is no abdominal tenderness.  Musculoskeletal:        General: Normal range of motion.     Cervical back: Neck supple.     Right lower leg: No edema.     Left lower leg: No edema.  Lymphadenopathy:     Cervical: No cervical adenopathy.  Skin:    General: Skin is warm and dry.  Neurological:     Mental Status: She is alert. Mental status is at baseline.     Comments: Has delayed verbal response;  BCAT 10-26-23: 24/50   Psychiatric:        Mood and Affect: Mood normal.     ASSESSMENT/ PLAN:  TODAY  Glucose intolerance: hgb A1c 5.5  2. Vaginal atrophy: estrace 1 gm three times weekly   3. Goiter: mild elevated fre  t4: 1.47 will monitor  PREVIOUS   4. Vascular dementia with behavioral disturbance / mood disorder: her weight is 136 pounds; will continue depakote 125 mg three times daily to help stabilize mood.   5. Mixed hyperlipidemia: cholesterol 235; ldl 127: will continue lipitor 10 mg  daily       Synthia Innocent NP [facility]  call [phone]

## 2023-12-27 ENCOUNTER — Non-Acute Institutional Stay (SKILLED_NURSING_FACILITY): Payer: Self-pay | Admitting: Adult Health

## 2023-12-27 ENCOUNTER — Encounter: Payer: Self-pay | Admitting: Adult Health

## 2023-12-27 DIAGNOSIS — R600 Localized edema: Secondary | ICD-10-CM

## 2023-12-27 NOTE — Progress Notes (Signed)
 Location:  [facility] Room Number: [id number] Place of Service:  SNF (31)   CODE STATUS: dnr   Allergies  Allergen Reactions   Penicillins Hives, Rash and Other (See Comments)    Childhood allergy   Penciclovir Rash    Hives   Alendronate Nausea And Vomiting and Nausea Only    GI intolerance    Chief Complaint  Patient presents with   Acute Visit    Bilateral lower extremity edema     HPI:  Staff report that she has significant bilateral lower extremity edema. She does have lower extremity pain present. She denies any chest pain or shortness of breath. Her weight is stable.   Past Medical History:  Diagnosis Date   Difficulty in walking    per [facility]   Falls    MAR   Glucose intolerance    Goiter, non-toxic    per [facility]   Hyperlipidemia    Hypokalemia 10/10/2023   Mood disorder (HCC)    [facility] date 10/28/23   Rhabdomyolysis 10/10/2023   Sepsis secondary to UTI (HCC) 10/10/2023   Venous stasis     Past Surgical History:  Procedure Laterality Date   AUGMENTATION MAMMAPLASTY Bilateral 2013-14   Mentor, In front of muscle. Bilateral lift    Social History   Socioeconomic History   Marital status: Married    Spouse name: Not on file   Number of children: Not on file   Years of education: Not on file   Highest education level: Not on file  Occupational History   Not on file  Tobacco Use   Smoking status: Never   Smokeless tobacco: Never  Substance and Sexual Activity   Alcohol use: Not Currently   Drug use: Never   Sexual activity: Not Currently  Other Topics Concern   Not on file  Social History Narrative   Not on file   Social Drivers of Health   Financial Resource Strain: Low Risk  (06/21/2023)   Received from [facility]   Overall Financial Resource Strain (CARDIA)    Difficulty of Paying Living Expenses: Not very hard  Food Insecurity: No Food Insecurity (10/10/2023)   Hunger Vital Sign     Worried About Running Out of Food in the Last Year: Never true    Ran Out of Food in the Last Year: Never true  Transportation Needs: No Transportation Needs (10/10/2023)   PRAPARE - Administrator, Civil Service (Medical): No    Lack of Transportation (Non-Medical): No  Physical Activity: Not on file  Stress: Not on file  Social Connections: Unknown (10/04/2023)   Received from [facility]   Social Network    Social Network: Not on file  Intimate Partner Violence: Not At Risk (10/10/2023)   Humiliation, Afraid, Rape, and Kick questionnaire    Fear of Current or Ex-Partner: No    Emotionally Abused: No    Physically Abused: No    Sexually Abused: No   Family History  Problem Relation Age of Onset   Breast cancer Mother 54      VITAL SIGNS BP 133/68   Pulse 89   Temp 98.1 F (36.7 C)   Resp 17   Ht 4' 9 (1.448 m)   Wt 132 lb 3.2 oz (60 kg)   SpO2 95%   BMI 28.61 kg/m   Outpatient Encounter Medications as of 12/27/2023  Medication Sig   acetaminophen  (TYLENOL ) 325 MG tablet  Take 650 mg by mouth every 6 (six) hours as needed.   divalproex  (DEPAKOTE ) 125 MG DR tablet Take 1 tablet (125 mg total) by mouth 3 (three) times daily.   fluticasone (FLONASE) 50 MCG/ACT nasal spray Place 2 sprays into both nostrils daily.   polyethylene glycol (MIRALAX  / GLYCOLAX ) 17 g packet Take 17 g by mouth daily as needed for mild constipation.   No facility-administered encounter medications on file as of 12/27/2023.     SIGNIFICANT DIAGNOSTIC EXAMS   PREVIOUS   10-10-23: wbc 13.8; hgb 17.9; hct 51.5; mcv 87.6 plt 396; glucose 131; bun 43; creat 1.08; k+ 2.4; na++ 135; ca 9.0; gfr 55; protein 7.3 albumin 3.7 urine culture: e-coli: cipro 10-11-23: tsh 3.452 free t4: 1.47 10-14-23: wbc 7.3; hgb 13.0; hct 38.8; mcv 91.3 plt 285; glucose 115; bun 23; creat 0.73; k+ 3.8; na++ 134; ca 8.7; gfr >60 11-03-23: wbc 5.3; hgb 11.6; hct 34.9; mcv 93.1 plt 348; glucose 109; bun 16;  creat 0.60; k+ 3.6; na++ 137; ca 9.3 gfr >60; protein 6.6 albumin 3.6   NO NEW LABS.   Review of Systems  Constitutional:  Negative for malaise/fatigue.  Respiratory:  Negative for cough and shortness of breath.   Cardiovascular:  Positive for leg swelling. Negative for chest pain and palpitations.  Gastrointestinal:  Negative for abdominal pain, constipation and heartburn.  Musculoskeletal:  Negative for back pain, joint pain and myalgias.  Skin: Negative.   Neurological:  Negative for dizziness.  Psychiatric/Behavioral:  The patient is not nervous/anxious.    Physical Exam Constitutional:      General: She is not in acute distress.    Appearance: She is well-developed. She is not diaphoretic.  Neck:     Thyroid : No thyromegaly.  Cardiovascular:     Rate and Rhythm: Normal rate and regular rhythm.     Pulses: Normal pulses.     Heart sounds: Murmur heard.  Pulmonary:     Effort: Pulmonary effort is normal. No respiratory distress.     Breath sounds: Normal breath sounds.  Abdominal:     General: Bowel sounds are normal. There is no distension.     Palpations: Abdomen is soft.     Tenderness: There is no abdominal tenderness.  Musculoskeletal:        General: Normal range of motion.     Cervical back: Neck supple.     Right lower leg: Edema present.     Left lower leg: Edema present.     Comments: Significant   Lymphadenopathy:     Cervical: No cervical adenopathy.  Skin:    General: Skin is warm and dry.  Neurological:     Mental Status: She is alert. Mental status is at baseline.     Comments: Has delayed verbal response;  BCAT 10-26-23: 24/50  Psychiatric:        Mood and Affect: Mood normal.      ASSESSMENT/ PLAN:  TODAY  Bilateral lower extremity edema: will begin lasix  20 mg daily and will repeat bmp on 01-05-24    Barnie Seip NP [facility]   call [phone]

## 2024-01-05 ENCOUNTER — Other Ambulatory Visit (HOSPITAL_COMMUNITY)
Admission: RE | Admit: 2024-01-05 | Discharge: 2024-01-05 | Disposition: A | Discharge status: Home / Self Care | Payer: Medicare Other | Source: Skilled Nursing Facility | Attending: Adult Health | Admitting: Adult Health

## 2024-01-05 DIAGNOSIS — F39 Unspecified mood [affective] disorder: Secondary | ICD-10-CM | POA: Insufficient documentation

## 2024-01-05 LAB — BASIC METABOLIC PANEL
Anion gap: 6 (ref 5–15)
BUN: 13 mg/dL (ref 8–23)
CO2: 25 mmol/L (ref 22–32)
Calcium: 8.9 mg/dL (ref 8.9–10.3)
Chloride: 101 mmol/L (ref 98–111)
Creatinine, Ser: 0.46 mg/dL (ref 0.44–1.00)
GFR, Estimated: >60 mL/min (ref >60)
Glucose, Bld: 104 mg/dL — ABNORMAL HIGH (ref 70–99)
Potassium: 3.3 mmol/L — ABNORMAL LOW (ref 3.5–5.1)
Sodium: 132 mmol/L — ABNORMAL LOW (ref 135–145)

## 2024-01-10 ENCOUNTER — Other Ambulatory Visit (HOSPITAL_COMMUNITY)
Admission: RE | Admit: 2024-01-10 | Discharge: 2024-01-10 | Disposition: A | Discharge status: Home / Self Care | Payer: Medicare Other | Source: Skilled Nursing Facility | Attending: Adult Health | Admitting: Adult Health

## 2024-01-10 DIAGNOSIS — F39 Unspecified mood [affective] disorder: Secondary | ICD-10-CM | POA: Diagnosis present

## 2024-01-10 LAB — BASIC METABOLIC PANEL
Anion gap: 6 (ref 5–15)
BUN: 17 mg/dL (ref 8–23)
CO2: 25 mmol/L (ref 22–32)
Calcium: 8.8 mg/dL — ABNORMAL LOW (ref 8.9–10.3)
Chloride: 102 mmol/L (ref 98–111)
Creatinine, Ser: 0.51 mg/dL (ref 0.44–1.00)
GFR, Estimated: >60 mL/min (ref >60)
Glucose, Bld: 108 mg/dL — ABNORMAL HIGH (ref 70–99)
Potassium: 3.6 mmol/L (ref 3.5–5.1)
Sodium: 133 mmol/L — ABNORMAL LOW (ref 135–145)

## 2024-01-11 ENCOUNTER — Encounter: Payer: Self-pay | Admitting: Internal Medicine

## 2024-01-11 ENCOUNTER — Non-Acute Institutional Stay (SKILLED_NURSING_FACILITY): Payer: Self-pay | Admitting: Internal Medicine

## 2024-01-11 DIAGNOSIS — F39 Unspecified mood [affective] disorder: Secondary | ICD-10-CM

## 2024-01-11 DIAGNOSIS — M25561 Pain in right knee: Secondary | ICD-10-CM

## 2024-01-11 DIAGNOSIS — F01518 Vascular dementia, unspecified severity, with other behavioral disturbance: Secondary | ICD-10-CM | POA: Diagnosis not present

## 2024-01-11 NOTE — Patient Instructions (Addendum)
 See assessment and plan under each diagnosis in the problem list and acutely for this visit. Knee imaging results and neurocognitive & behavioral issues discussed with her brother.

## 2024-01-11 NOTE — Progress Notes (Signed)
 NURSING HOME LOCATION:  [facility] ROOM NUMBER:  [id number] W  CODE STATUS:  DNR  PCP:  Britt Candle NP  This is a nursing facility follow up visit of chronic medical diagnoses & to document compliance with Regulation 483.30 (c) in The Long Term Care Survey Manual Phase 2 which mandates caregiver visit ( visits can alternate among physician, PA or NP as per statutes) within 10 days of 30 days / 60 days/ 90 days post admission to SNF date    Interim medical record and care since last SNF visit was updated with review of diagnostic studies and change in clinical status since last visit were documented.  HPI: She was admitted to the SNF after being hospitalized 10/14 - 10/14/2023 with rhabdomyolysis following a fall in the context of UTI.  CK peaked at 2327. Here at the facility she has exhibited mood disorder with behavioral issues.  These include locking herself in the bathroom; calling police repeatedly claiming theft of personal items; physically accosting staff; and being in the hallways partially unclothed.  Because of this she was seen in the ED 11/7.  She was not felt to be a danger to herself and not felt to require inpatient psychiatric management.  Depakote  125 mg 3 times daily was initiated and she returned to the facility. Behaviors have improved.  Depakote  level was checked and was low normal at 51.  Evaluation has included mental status testing with a BCAT screening.  Her score was 24 out of 50 indicating moderate-severe dementia.  CT of the head on 10/10/2023 had revealed chronic small vessel ischemic changes of the supratentorial white matter suggesting vascular dementia. Multiple abrasions sustained in the fall PTA to the hospital have been followed by the SNF Wound Care Nurse; these have resolved. The patient recently fell striking her right knee.  She has had residual pain and swelling since.  Imaging serially showed decreased soft tissue swelling with no bony  abnormality.  Review of systems is completely negative except for discomfort in the right knee.  All other queries were answered with a monosyllabic "no."    Constitutional: No fever, significant weight change, fatigue  Eyes: No redness, discharge, pain, vision change ENT/mouth: No nasal congestion,  purulent discharge, earache, change in hearing, sore throat  Cardiovascular: No chest pain, palpitations, paroxysmal nocturnal dyspnea, claudication, edema  Respiratory: No cough, sputum production, hemoptysis, DOE, significant snoring, apnea   Gastrointestinal: No heartburn, dysphagia, abdominal pain, nausea /vomiting, rectal bleeding, melena, change in bowels Genitourinary: No dysuria, hematuria, pyuria, incontinence, nocturia Dermatologic: No rash, pruritus, change in appearance of skin Neurologic: No dizziness, headache, syncope, seizures, numbness, tingling Psychiatric: No significant anxiety, depression, insomnia, anorexia Endocrine: No change in hair/skin/nails, excessive thirst, excessive hunger, excessive urination  Hematologic/lymphatic: No significant bruising, lymphadenopathy, abnormal bleeding Allergy/immunology: No itchy/watery eyes, significant sneezing, urticaria, angioedema  Physical exam:  Pertinent or positive findings: She appears her age & adequately nourished.  She was withdrawn and exhibiting no eye contact.  Her brother from [place]  was with her and he mentioned the residual knee pain which she subsequently validated.  A grade 2 systolic murmur is present at the base.  Pedal pulses are decreased.  She has 1+ edema at the LLE sock line and 1/2+ at the right sock line.  Celine Collard' sign is negative.  There is faint ecchymosis & swelling in the infrapatellar area on the right and fusiform enlargement of the right knee.  Interosseous wasting is also suggested.  General appearance:  no acute distress, increased work of breathing is present.   Lymphatic: No lymphadenopathy about the  head, neck, axilla. Eyes: No conjunctival inflammation or lid edema is present. There is no scleral icterus. Ears:  External ear exam shows no significant lesions or deformities.   Nose:  External nasal examination shows no deformity or inflammation. Nasal mucosa are pink and moist without lesions, exudates Oral exam:  Lips and gums are healthy appearing. There is no oropharyngeal erythema or exudate. Neck:  No thyromegaly, masses, tenderness noted.    Heart:  Normal rate and regular rhythm. S1 and S2 normal without gallop, click, rub .  Lungs: Chest clear to auscultation without wheezes, rhonchi, rales, rubs. Abdomen: Bowel sounds are normal. Abdomen is soft and nontender with no organomegaly, hernias, masses. GU: Deferred  Extremities:  No cyanosis, clubbing  Neurologic exam :Balance, Rhomberg, finger to nose testing could not be completed due to clinical state Skin: Warm & dry w/o tenting. No significant lesions or rash.  See summary under each active problem in the Problem List with associated updated therapeutic plan

## 2024-01-11 NOTE — Assessment & Plan Note (Addendum)
 For completeness sake consider checking B12 and VDRL/RPR.  TSH was therapeutic. Depakote  level is low therapeutic.  Continue to monitor behaviors.

## 2024-01-11 NOTE — Assessment & Plan Note (Signed)
 Behavioral issues improved on Depakote  125 mg tid. Continue monitor.

## 2024-01-26 ENCOUNTER — Non-Acute Institutional Stay (SKILLED_NURSING_FACILITY): Payer: Medicare Other | Admitting: Adult Health

## 2024-01-26 ENCOUNTER — Encounter: Payer: Self-pay | Admitting: Adult Health

## 2024-01-26 ENCOUNTER — Other Ambulatory Visit (HOSPITAL_COMMUNITY)
Admission: RE | Admit: 2024-01-26 | Discharge: 2024-01-26 | Disposition: A | Discharge status: Home / Self Care | Payer: Medicare Other | Source: Skilled Nursing Facility | Attending: Adult Health | Admitting: Adult Health

## 2024-01-26 DIAGNOSIS — R051 Acute cough: Secondary | ICD-10-CM | POA: Diagnosis present

## 2024-01-26 DIAGNOSIS — J069 Acute upper respiratory infection, unspecified: Secondary | ICD-10-CM | POA: Diagnosis not present

## 2024-01-26 LAB — CBC WITH DIFFERENTIAL/PLATELET
Abs Immature Granulocytes: 0.06 10*3/uL (ref 0.00–0.07)
Basophils Absolute: 0.1 10*3/uL (ref 0.0–0.1)
Basophils Relative: 1 %
Eosinophils Absolute: 0 10*3/uL (ref 0.0–0.5)
Eosinophils Relative: 0 %
HCT: 34.4 % — ABNORMAL LOW (ref 36.0–46.0)
Hemoglobin: 11.6 g/dL — ABNORMAL LOW (ref 12.0–15.0)
Immature Granulocytes: 1 %
Lymphocytes Relative: 15 %
Lymphs Abs: 1.6 10*3/uL (ref 0.7–4.0)
MCH: 30.9 pg (ref 26.0–34.0)
MCHC: 33.7 g/dL (ref 30.0–36.0)
MCV: 91.5 fL (ref 80.0–100.0)
Monocytes Absolute: 1.3 10*3/uL — ABNORMAL HIGH (ref 0.1–1.0)
Monocytes Relative: 12 %
Neutro Abs: 7.7 10*3/uL (ref 1.7–7.7)
Neutrophils Relative %: 71 %
Platelets: 289 10*3/uL (ref 150–400)
RBC: 3.76 MIL/uL — ABNORMAL LOW (ref 3.87–5.11)
RDW: 12.7 % (ref 11.5–15.5)
WBC: 10.7 10*3/uL — ABNORMAL HIGH (ref 4.0–10.5)
nRBC: 0 % (ref 0.0–0.2)

## 2024-01-26 LAB — LACTIC ACID, PLASMA: Lactic Acid, Venous: 1 mmol/L (ref 0.5–1.9)

## 2024-01-26 LAB — RESP PANEL BY RT-PCR (FLU A&B, COVID) ARPGX2
Influenza A by PCR: NEGATIVE
Influenza B by PCR: NEGATIVE
SARS Coronavirus 2 by RT PCR: NEGATIVE

## 2024-01-26 NOTE — Progress Notes (Signed)
Location:  [facility] Room Number: 157 Place of Service:  SNF (31)   CODE STATUS: dnr   Allergies  Allergen Reactions   Penicillins Hives, Rash and Other (See Comments)    Childhood allergy   Penciclovir Rash    Hives   Alendronate Nausea And Vomiting and Nausea Only    GI intolerance    Chief Complaint  Patient presents with   Acute Visit    Low grade fever     HPI:  She has a temp of 99.5. She does have some chills present. She does have a cough that is nonproductive. She does tell me that she does not feel good and is tired. Her respiratory panel has come back negative. More than likely this does represent a virus. Will get chest x-ray to look for pneumonia.   Past Medical History:  Diagnosis Date   Difficulty in walking    per [facility]   Falls    MAR   Glucose intolerance    Goiter, non-toxic    per [facility]   Hyperlipidemia    Hypokalemia 10/10/2023   Mood disorder (HCC)    per [facility] encounter date 10/28/23   Rhabdomyolysis 10/10/2023   Sepsis secondary to UTI (HCC) 10/10/2023   Venous stasis     Past Surgical History:  Procedure Laterality Date   AUGMENTATION MAMMAPLASTY Bilateral 2013-14   Mentor, In front of muscle. Bilateral lift    Social History   Socioeconomic History   Marital status: Married    Spouse name: Not on file   Number of children: Not on file   Years of education: Not on file   Highest education level: Not on file  Occupational History   Not on file  Tobacco Use   Smoking status: Never   Smokeless tobacco: Never  Substance and Sexual Activity   Alcohol use: Not Currently   Drug use: Never   Sexual activity: Not Currently  Other Topics Concern   Not on file  Social History Narrative   Not on file   Social Drivers of Health   Financial Resource Strain: Low Risk  (06/21/2023)   Received from [facility]   Overall Financial Resource Strain (CARDIA)    Difficulty of Paying Living  Expenses: Not very hard  Food Insecurity: No Food Insecurity (10/10/2023)   Hunger Vital Sign    Worried About Running Out of Food in the Last Year: Never true    Ran Out of Food in the Last Year: Never true  Transportation Needs: No Transportation Needs (10/10/2023)   PRAPARE - Administrator, Civil Service (Medical): No    Lack of Transportation (Non-Medical): No  Physical Activity: Not on file  Stress: Not on file  Social Connections: Unknown (10/04/2023)   Received from [facility]   Social Network    Social Network: Not on file  Intimate Partner Violence: Not At Risk (10/10/2023)   Humiliation, Afraid, Rape, and Kick questionnaire    Fear of Current or Ex-Partner: No    Emotionally Abused: No    Physically Abused: No    Sexually Abused: No   Family History  Problem Relation Age of Onset   Breast cancer Mother 12      VITAL SIGNS BP 129/63   Pulse 78   Temp 99.5 F (37.5 C)   Resp 20   Ht 4\' 9"  (1.448 m)   Wt 136 lb 12.8 oz (62.1 kg)  SpO2 97%   BMI 29.60 kg/m   Outpatient Encounter Medications as of 01/26/2024  Medication Sig   acetaminophen (TYLENOL) 325 MG tablet Take 650 mg by mouth every 6 (six) hours as needed.   divalproex (DEPAKOTE) 125 MG DR tablet Take 1 tablet (125 mg total) by mouth 3 (three) times daily.   fluticasone (FLONASE) 50 MCG/ACT nasal spray Place 2 sprays into both nostrils daily.   furosemide (LASIX) 20 MG tablet Take 20 mg by mouth daily.   polyethylene glycol (MIRALAX / GLYCOLAX) 17 g packet Take 17 g by mouth daily as needed for mild constipation.   No facility-administered encounter medications on file as of 01/26/2024.     SIGNIFICANT DIAGNOSTIC EXAMS  PREVIOUS   10-10-23: wbc 13.8; hgb 17.9; hct 51.5; mcv 87.6 plt 396; glucose 131; bun 43; creat 1.08; k+ 2.4; na++ 135; ca 9.0; gfr 55; protein 7.3 albumin 3.7 urine culture: e-coli: cipro 10-11-23: tsh 3.452 free t4: 1.47 10-14-23: wbc 7.3; hgb 13.0; hct 38.8; mcv  91.3 plt 285; glucose 115; bun 23; creat 0.73; k+ 3.8; na++ 134; ca 8.7; gfr >60 11-03-23: wbc 5.3; hgb 11.6; hct 34.9; mcv 93.1 plt 348; glucose 109; bun 16; creat 0.60; k+ 3.6; na++ 137; ca 9.3 gfr >60; protein 6.6 albumin 3.6   TODAY  01-26-24: wbc 10.7; hgb 11.6; hct 34.4; mcv 91.5 plt 289 lactic acid 1.0   Review of Systems  Constitutional:  Positive for chills, fever and malaise/fatigue.  Respiratory:  Positive for cough. Negative for sputum production and shortness of breath.   Cardiovascular:  Negative for chest pain, palpitations and leg swelling.  Gastrointestinal:  Negative for abdominal pain, constipation and heartburn.  Musculoskeletal:  Negative for back pain, joint pain and myalgias.  Skin: Negative.   Neurological:  Negative for dizziness.  Psychiatric/Behavioral:  The patient is not nervous/anxious.    Physical Exam Constitutional:      General: She is not in acute distress.    Appearance: She is well-developed. She is not diaphoretic.  HENT:     Nose: Nose normal.     Mouth/Throat:     Mouth: Mucous membranes are moist.     Pharynx: Oropharynx is clear.  Eyes:     Conjunctiva/sclera: Conjunctivae normal.  Neck:     Thyroid: No thyromegaly.  Cardiovascular:     Rate and Rhythm: Normal rate and regular rhythm.     Heart sounds: Normal heart sounds.  Pulmonary:     Effort: Pulmonary effort is normal. No respiratory distress.     Breath sounds: Normal breath sounds.  Abdominal:     General: Bowel sounds are normal. There is no distension.     Palpations: Abdomen is soft.     Tenderness: There is no abdominal tenderness.  Musculoskeletal:        General: Normal range of motion.     Cervical back: Neck supple.     Right lower leg: Edema present.     Left lower leg: Edema present.  Lymphadenopathy:     Cervical: No cervical adenopathy.  Skin:    General: Skin is warm and dry.  Neurological:     Mental Status: She is alert. Mental status is at baseline.      Comments: Has delayed verbal response;  BCAT 10-26-23: 24/50  Psychiatric:        Mood and Affect: Mood normal.       ASSESSMENT/ PLAN:  TODAY  Upper respiratory virus: more than likely this does represent  a virus. Will get a chest x-ray.    Synthia Innocent NP [facility]  call [phone]

## 2024-01-27 ENCOUNTER — Ambulatory Visit (HOSPITAL_COMMUNITY)
Admission: RE | Admit: 2024-01-27 | Discharge: 2024-01-27 | Disposition: A | Discharge status: Home / Self Care | Payer: Medicare Other | Source: Ambulatory Visit | Attending: Adult Health | Admitting: Adult Health

## 2024-01-27 ENCOUNTER — Non-Acute Institutional Stay (SKILLED_NURSING_FACILITY): Payer: Self-pay | Admitting: Adult Health

## 2024-01-27 ENCOUNTER — Other Ambulatory Visit (HOSPITAL_COMMUNITY): Payer: Self-pay | Admitting: Adult Health

## 2024-01-27 ENCOUNTER — Encounter: Payer: Self-pay | Admitting: Adult Health

## 2024-01-27 DIAGNOSIS — F01518 Vascular dementia, unspecified severity, with other behavioral disturbance: Secondary | ICD-10-CM | POA: Diagnosis not present

## 2024-01-27 DIAGNOSIS — F39 Unspecified mood [affective] disorder: Secondary | ICD-10-CM

## 2024-01-27 DIAGNOSIS — M25561 Pain in right knee: Secondary | ICD-10-CM | POA: Insufficient documentation

## 2024-01-27 DIAGNOSIS — M7989 Other specified soft tissue disorders: Secondary | ICD-10-CM | POA: Insufficient documentation

## 2024-01-27 DIAGNOSIS — R4189 Other symptoms and signs involving cognitive functions and awareness: Secondary | ICD-10-CM | POA: Diagnosis not present

## 2024-01-27 NOTE — Progress Notes (Signed)
Location:  [facility] Room Number: 157 Place of Service:  SNF (31)   CODE STATUS: dnr   Allergies  Allergen Reactions   Penicillins Hives, Rash and Other (See Comments)    Childhood allergy   Penciclovir Rash    Hives   Alendronate Nausea And Vomiting and Nausea Only    GI intolerance    Chief Complaint  Patient presents with   Acute Visit    Care plan meeting.     HPI:  We have come together for her care plan meeting. Family present.  BIMS 7/15 mood 0/30. She has been wandering around facility and has threatened staff. She is wheelchair bound has had 6 falls usually between 3:45 PM and 5:45 AM; has numerous interventions in place. Her falls have not increased since starting on the depakote.  She requires mod to dependent assist with her adl care. She is frequently incontinent of bladder and bowel. Dietary: setup for meals; regular diet appetite 76-100% weight is 136.8 pounds. Therapy: none at this time. She is having right knee pain. She  states that the pain is terrible. A venous doppler was done and is negative for dvt.  She will continue to be followed for her chronic illnesses including:   Mood disorder  Moderate cognitive impairment Vascular dementia with behavioral disturbance  Past Medical History:  Diagnosis Date   Difficulty in walking    per [facility]   Falls    MAR   Glucose intolerance    Goiter, non-toxic    per [facility]   Hyperlipidemia    Hypokalemia 10/10/2023   Mood disorder (HCC)    [facility] date 10/28/23   Rhabdomyolysis 10/10/2023   Sepsis secondary to UTI (HCC) 10/10/2023   Venous stasis     Past Surgical History:  Procedure Laterality Date   AUGMENTATION MAMMAPLASTY Bilateral 2013-14   Mentor, In front of muscle. Bilateral lift    Social History   Socioeconomic History   Marital status: Married    Spouse name: Not on file   Number of children: Not on file   Years of education: Not on file    Highest education level: Not on file  Occupational History   Not on file  Tobacco Use   Smoking status: Never   Smokeless tobacco: Never  Substance and Sexual Activity   Alcohol use: Not Currently   Drug use: Never   Sexual activity: Not Currently  Other Topics Concern   Not on file  Social History Narrative   Not on file   Social Drivers of Health   Financial Resource Strain: Low Risk  (06/21/2023)   Received from [facility]   Overall Financial Resource Strain (CARDIA)    Difficulty of Paying Living Expenses: Not very hard  Food Insecurity: No Food Insecurity (10/10/2023)   Hunger Vital Sign    Worried About Running Out of Food in the Last Year: Never true    Ran Out of Food in the Last Year: Never true  Transportation Needs: No Transportation Needs (10/10/2023)   PRAPARE - Administrator, Civil Service (Medical): No    Lack of Transportation (Non-Medical): No  Physical Activity: Not on file  Stress: Not on file  Social Connections: Unknown (10/04/2023)   Received from [facility]   Social Network    Social Network: Not on file  Intimate Partner Violence: Not At Risk (10/10/2023)   Humiliation, Afraid, Rape, and Kick questionnaire  Fear of Current or Ex-Partner: No    Emotionally Abused: No    Physically Abused: No    Sexually Abused: No   Family History  Problem Relation Age of Onset   Breast cancer Mother 51      VITAL SIGNS BP 139/81   Pulse 72   Temp (!) 97.4 F (36.3 C)   Resp 20   Ht 5\' 1"  (1.549 m)   Wt 136 lb 12.8 oz (62.1 kg)   SpO2 97%   BMI 25.85 kg/m   Outpatient Encounter Medications as of 01/27/2024  Medication Sig   melatonin 5 MG TABS Take 5 mg by mouth at bedtime.   acetaminophen (TYLENOL) 325 MG tablet Take 650 mg by mouth every 6 (six) hours as needed.   divalproex (DEPAKOTE) 125 MG DR tablet Take 1 tablet (125 mg total) by mouth 3 (three) times daily.   fluticasone (FLONASE) 50 MCG/ACT nasal spray Place 2 sprays  into both nostrils daily.   furosemide (LASIX) 20 MG tablet Take 20 mg by mouth daily.   polyethylene glycol (MIRALAX / GLYCOLAX) 17 g packet Take 17 g by mouth daily as needed for mild constipation.   No facility-administered encounter medications on file as of 01/27/2024.     SIGNIFICANT DIAGNOSTIC EXAMS  PREVIOUS   10-10-23: wbc 13.8; hgb 17.9; hct 51.5; mcv 87.6 plt 396; glucose 131; bun 43; creat 1.08; k+ 2.4; na++ 135; ca 9.0; gfr 55; protein 7.3 albumin 3.7 urine culture: e-coli: cipro 10-11-23: tsh 3.452 free t4: 1.47 10-14-23: wbc 7.3; hgb 13.0; hct 38.8; mcv 91.3 plt 285; glucose 115; bun 23; creat 0.73; k+ 3.8; na++ 134; ca 8.7; gfr >60 11-03-23: wbc 5.3; hgb 11.6; hct 34.9; mcv 93.1 plt 348; glucose 109; bun 16; creat 0.60; k+ 3.6; na++ 137; ca 9.3 gfr >60; protein 6.6 albumin 3.6  01-26-24: wbc 10.7; hgb 11.6; hct 34.4; mcv 91.5 plt 289 lactic acid 1.0   NO NEW LABS.   Review of Systems  Constitutional:  Positive for malaise/fatigue.  Respiratory:  Negative for cough and shortness of breath.   Cardiovascular:  Negative for chest pain, palpitations and leg swelling.  Gastrointestinal:  Negative for abdominal pain, constipation and heartburn.  Musculoskeletal:  Positive for joint pain. Negative for back pain and myalgias.       Right knee   Skin: Negative.   Neurological:  Negative for dizziness.  Psychiatric/Behavioral:  The patient is not nervous/anxious.    Physical Exam Constitutional:      General: She is not in acute distress.    Appearance: She is well-developed. She is not diaphoretic.  Neck:     Thyroid: No thyromegaly.  Cardiovascular:     Rate and Rhythm: Normal rate and regular rhythm.     Heart sounds: Normal heart sounds.  Pulmonary:     Effort: Pulmonary effort is normal. No respiratory distress.     Breath sounds: Normal breath sounds.  Abdominal:     General: Bowel sounds are normal. There is no distension.     Palpations: Abdomen is soft.      Tenderness: There is no abdominal tenderness.  Musculoskeletal:        General: Swelling present.     Cervical back: Neck supple.     Right lower leg: No edema.     Left lower leg: No edema.     Comments: Right knee is tender to touch with small amount of swelling present. There is no redness or warmth present.  Lymphadenopathy:     Cervical: No cervical adenopathy.  Skin:    General: Skin is warm and dry.  Neurological:     Mental Status: She is alert. Mental status is at baseline.  Psychiatric:        Mood and Affect: Mood normal.      ASSESSMENT/ PLAN:  TODAY  Mood disorder Moderate cognitive impairment Vascular dementia with behavioral disturbance Right knee pain  Will begin mobic 15 mg daily  Will continue current plan of care Will continue to monitor her status; if her pain is not improving by Monday will setup ortho consult.    Synthia Innocent NP [facility]  call [phone]

## 2024-02-07 ENCOUNTER — Ambulatory Visit (INDEPENDENT_AMBULATORY_CARE_PROVIDER_SITE_OTHER): Payer: Medicare Other | Admitting: Orthopedic Surgery

## 2024-02-07 ENCOUNTER — Encounter: Payer: Self-pay | Admitting: Orthopedic Surgery

## 2024-02-07 VITALS — BP 134/72 | HR 80

## 2024-02-07 DIAGNOSIS — M87051 Idiopathic aseptic necrosis of right femur: Secondary | ICD-10-CM | POA: Diagnosis not present

## 2024-02-08 ENCOUNTER — Encounter: Payer: Self-pay | Admitting: Orthopedic Surgery

## 2024-02-08 NOTE — Progress Notes (Signed)
New Patient Visit  Assessment: Barbara Eaton is a 73 y.o. female with the following: 1. Avascular necrosis of right femur [facility])  Plan: Deneise Lever had what appears to be chronic AVN of the right hip.  There is some acetabular wear.  It is unclear what is caused her issues.  Chronicity is not certain.  Nonetheless, this is a chronic issue.  Ultimately, she may benefit from a total hip replacement, but due to the acetabular wear, I would defer to along with more experience.  Per my review of the chart, Dr. Charolett Bumpers was preparing to proceed with total hip arthroplasty a few months ago.  I would recommend that she return to see him for further evaluation.  Due to her dementia and confusion, she may have difficulty with postoperative restrictions.  Unfortunately, during her visit today it was very difficult to understand what has been going on and how long it has been an issue as she was unable to answer questions appropriately.  In addition, no family was available.  If there are any further questions, I am happy to provide information.  Otherwise, she will return to clinic as needed.  Follow-up: Return if symptoms worsen or fail to improve.  Subjective:  Chief Complaint  Patient presents with   Knee Pain    Right knee pain     History of Present Illness: Barbara Eaton is a 73 y.o. female who has been referred by Synthia Innocent, NP for evaluation of right knee pain.  Upon presentation, she states that she does not have right knee pain.  She is complaining of pain in her right hip.  She is unable to provide additional information.  No staff similar with her care is available in the room today.  No family is available.  Per my review of the chart, she has chronic right hip pain.  This been ongoing for a while.  She was post to have surgery in the fall.  Unsure what has happened since then.  She is unable to walk.  She cannot tell me how long she has not been able to walk.   Review  of Systems: No fevers or chills No numbness or tingling    Medical History:  Past Medical History:  Diagnosis Date   Difficulty in walking    per [facility]   Falls    MAR   Glucose intolerance    Goiter, non-toxic    per [facility]   Hyperlipidemia    Hypokalemia 10/10/2023   Mood disorder (HCC)    per [facility] encounter date 10/28/23   Rhabdomyolysis 10/10/2023   Sepsis secondary to UTI (HCC) 10/10/2023   Venous stasis     Past Surgical History:  Procedure Laterality Date   AUGMENTATION MAMMAPLASTY Bilateral 2013-14   Mentor, In front of muscle. Bilateral lift    Family History  Problem Relation Age of Onset   Breast cancer Mother 77   Social History   Tobacco Use   Smoking status: Never   Smokeless tobacco: Never  Substance Use Topics   Alcohol use: Not Currently   Drug use: Never    Allergies  Allergen Reactions   Penicillins Hives, Rash and Other (See Comments)    Childhood allergy   Penciclovir Rash    Hives   Alendronate Nausea And Vomiting and Nausea Only    GI intolerance    Current Meds  Medication Sig   acetaminophen (TYLENOL) 325 MG tablet Take 650 mg by  mouth every 6 (six) hours as needed.   divalproex (DEPAKOTE) 125 MG DR tablet Take 1 tablet (125 mg total) by mouth 3 (three) times daily.   furosemide (LASIX) 20 MG tablet Take 20 mg by mouth daily.   melatonin 5 MG TABS Take 5 mg by mouth at bedtime.   meloxicam (MOBIC) 15 MG tablet Take 15 mg by mouth daily.   polyethylene glycol (MIRALAX / GLYCOLAX) 17 g packet Take 17 g by mouth daily as needed for mild constipation.    Objective: BP 134/72   Pulse 80   Physical Exam:  General: No acute distress. and Seated in a wheelchair. Gait: Unable to ambulate.  Pain in the hip with movement of the right hip.  She is able to maintain straight raise.  No tenderness to palpation medial lateral joint line of the right knee.  She is good range of motion of the right knee.  IMAGING: I  personally reviewed images previously obtained in clinic   X-rays of the right knee with minimal degenerative changes.  X-rays of the right hip demonstrates a chronic.  AVN, with complete collapse.  There appears to be erosion of the acetabulum as well.   New Medications:  No orders of the defined types were placed in this encounter.     Oliver Barre, MD  02/08/2024 8:31 AM

## 2024-02-13 ENCOUNTER — Non-Acute Institutional Stay (SKILLED_NURSING_FACILITY): Payer: Self-pay | Admitting: Adult Health

## 2024-02-13 ENCOUNTER — Encounter: Payer: Self-pay | Admitting: Adult Health

## 2024-02-13 DIAGNOSIS — R600 Localized edema: Secondary | ICD-10-CM | POA: Diagnosis not present

## 2024-02-13 NOTE — Progress Notes (Unsigned)
Location:  [facility] Room Number: 157 Place of Service:  SNF (31)   CODE STATUS: dnr   Allergies  Allergen Reactions   Penicillins Hives, Rash and Other (See Comments)    Childhood allergy   Penciclovir Rash    Hives   Alendronate Nausea And Vomiting and Nausea Only    GI intolerance    Chief Complaint  Patient presents with   Acute Visit    Worsening edema     HPI:  Staff reports that her edema is getting worse especially on her right lower extremity. She denies any pain present. There are no indications of inflammation present. No redness or warmth present. She is presently taking lasix 20 mg daily.   Past Medical History:  Diagnosis Date   Difficulty in walking    per [facility]   Falls    MAR   Glucose intolerance    Goiter, non-toxic    per [facility]   Hyperlipidemia    Hypokalemia 10/10/2023   Mood disorder (HCC)    [facility] date 10/28/23   Rhabdomyolysis 10/10/2023   Sepsis secondary to UTI (HCC) 10/10/2023   Venous stasis     Past Surgical History:  Procedure Laterality Date   AUGMENTATION MAMMAPLASTY Bilateral 2013-14   Mentor, In front of muscle. Bilateral lift    Social History   Socioeconomic History   Marital status: Married    Spouse name: Not on file   Number of children: Not on file   Years of education: Not on file   Highest education level: Not on file  Occupational History   Not on file  Tobacco Use   Smoking status: Never   Smokeless tobacco: Never  Substance and Sexual Activity   Alcohol use: Not Currently   Drug use: Never   Sexual activity: Not Currently  Other Topics Concern   Not on file  Social History Narrative   Not on file   Social Drivers of Health   Financial Resource Strain: Low Risk  (06/21/2023)   Received from [facility]   Overall Financial Resource Strain (CARDIA)    Difficulty of Paying Living Expenses: Not very hard  Food Insecurity: No Food Insecurity  (10/10/2023)   Hunger Vital Sign    Worried About Running Out of Food in the Last Year: Never true    Ran Out of Food in the Last Year: Never true  Transportation Needs: No Transportation Needs (10/10/2023)   PRAPARE - Administrator, Civil Service (Medical): No    Lack of Transportation (Non-Medical): No  Physical Activity: Not on file  Stress: Not on file  Social Connections: Unknown (10/04/2023)   Received from [facility]   Social Network    Social Network: Not on file  Intimate Partner Violence: Not At Risk (10/10/2023)   Humiliation, Afraid, Rape, and Kick questionnaire    Fear of Current or Ex-Partner: No    Emotionally Abused: No    Physically Abused: No    Sexually Abused: No   Family History  Problem Relation Age of Onset   Breast cancer Mother 10      VITAL SIGNS BP 131/72   Pulse 89   Temp 98 F (36.7 C)   Resp 16   Ht 4\' 9"  (1.448 m)   Wt 138 lb (62.6 kg)   SpO2 98%   BMI 29.86 kg/m   Outpatient Encounter Medications as of 02/13/2024  Medication Sig  acetaminophen (TYLENOL) 325 MG tablet Take 650 mg by mouth every 6 (six) hours as needed.   divalproex (DEPAKOTE) 125 MG DR tablet Take 1 tablet (125 mg total) by mouth 3 (three) times daily.   fluticasone (FLONASE) 50 MCG/ACT nasal spray Place 2 sprays into both nostrils daily.   furosemide (LASIX) 20 MG tablet Take 20 mg by mouth daily.   melatonin 5 MG TABS Take 5 mg by mouth at bedtime.   meloxicam (MOBIC) 15 MG tablet Take 15 mg by mouth daily.   polyethylene glycol (MIRALAX / GLYCOLAX) 17 g packet Take 17 g by mouth daily as needed for mild constipation.   No facility-administered encounter medications on file as of 02/13/2024.     SIGNIFICANT DIAGNOSTIC EXAMS   PREVIOUS   10-10-23: wbc 13.8; hgb 17.9; hct 51.5; mcv 87.6 plt 396; glucose 131; bun 43; creat 1.08; k+ 2.4; na++ 135; ca 9.0; gfr 55; protein 7.3 albumin 3.7 urine culture: e-coli: cipro 10-11-23: tsh 3.452 free t4:  1.47 10-14-23: wbc 7.3; hgb 13.0; hct 38.8; mcv 91.3 plt 285; glucose 115; bun 23; creat 0.73; k+ 3.8; na++ 134; ca 8.7; gfr >60 11-03-23: wbc 5.3; hgb 11.6; hct 34.9; mcv 93.1 plt 348; glucose 109; bun 16; creat 0.60; k+ 3.6; na++ 137; ca 9.3 gfr >60; protein 6.6 albumin 3.6  01-26-24: wbc 10.7; hgb 11.6; hct 34.4; mcv 91.5 plt 289 lactic acid 1.0   NO NEW LABS.   Review of Systems  Constitutional:  Negative for malaise/fatigue.  Respiratory:  Negative for cough and shortness of breath.   Cardiovascular:  Positive for leg swelling. Negative for chest pain and palpitations.  Gastrointestinal:  Negative for abdominal pain, constipation and heartburn.  Musculoskeletal:  Negative for back pain, joint pain and myalgias.  Skin: Negative.   Neurological:  Negative for dizziness.  Psychiatric/Behavioral:  The patient is not nervous/anxious.    Physical Exam Constitutional:      General: She is not in acute distress.    Appearance: She is well-developed. She is not diaphoretic.  Neck:     Thyroid: No thyromegaly.  Cardiovascular:     Rate and Rhythm: Normal rate and regular rhythm.     Heart sounds: Normal heart sounds.  Pulmonary:     Effort: Pulmonary effort is normal. No respiratory distress.     Breath sounds: Normal breath sounds.  Abdominal:     General: Bowel sounds are normal. There is no distension.     Palpations: Abdomen is soft.     Tenderness: There is no abdominal tenderness.  Musculoskeletal:        General: Normal range of motion.     Cervical back: Neck supple.     Right lower leg: Edema present.     Left lower leg: Edema present.     Comments: R>L  Lymphadenopathy:     Cervical: No cervical adenopathy.  Skin:    General: Skin is warm and dry.  Neurological:     Mental Status: She is alert. Mental status is at baseline.  Psychiatric:        Mood and Affect: Mood normal.     ASSESSMENT/ PLAN:  TODAY  Bilateral lower extremity edema: will give an extra 20 mg  daily for 3 days and will then repeat bmp on 02-16-24.    Synthia Innocent NP [facility]  call [phone]

## 2024-02-16 ENCOUNTER — Other Ambulatory Visit (HOSPITAL_COMMUNITY)
Admission: RE | Admit: 2024-02-16 | Discharge: 2024-02-16 | Disposition: A | Discharge status: Home / Self Care | Payer: Medicare Other | Source: Skilled Nursing Facility | Attending: Adult Health | Admitting: Adult Health

## 2024-02-16 DIAGNOSIS — E876 Hypokalemia: Secondary | ICD-10-CM | POA: Diagnosis present

## 2024-02-16 LAB — BASIC METABOLIC PANEL
Anion gap: 9 (ref 5–15)
BUN: 20 mg/dL (ref 8–23)
CO2: 26 mmol/L (ref 22–32)
Calcium: 8.9 mg/dL (ref 8.9–10.3)
Chloride: 97 mmol/L — ABNORMAL LOW (ref 98–111)
Creatinine, Ser: 0.59 mg/dL (ref 0.44–1.00)
GFR, Estimated: >60 mL/min (ref >60)
Glucose, Bld: 102 mg/dL — ABNORMAL HIGH (ref 70–99)
Potassium: 3.5 mmol/L (ref 3.5–5.1)
Sodium: 132 mmol/L — ABNORMAL LOW (ref 135–145)

## 2024-02-20 ENCOUNTER — Non-Acute Institutional Stay (SKILLED_NURSING_FACILITY): Payer: Self-pay | Admitting: Adult Health

## 2024-02-20 ENCOUNTER — Encounter: Payer: Self-pay | Admitting: Adult Health

## 2024-02-20 DIAGNOSIS — F39 Unspecified mood [affective] disorder: Secondary | ICD-10-CM

## 2024-02-20 DIAGNOSIS — F01518 Vascular dementia, unspecified severity, with other behavioral disturbance: Secondary | ICD-10-CM | POA: Diagnosis not present

## 2024-02-20 DIAGNOSIS — E782 Mixed hyperlipidemia: Secondary | ICD-10-CM

## 2024-02-20 DIAGNOSIS — E7439 Other disorders of intestinal carbohydrate absorption: Secondary | ICD-10-CM

## 2024-02-20 NOTE — Progress Notes (Signed)
 Location:  [facility] Room Number: [id number] Place of Service:  SNF (31)   CODE STATUS: dnr   Allergies  Allergen Reactions   Penicillins Hives, Rash and Other (See Comments)    Childhood allergy   Penciclovir Rash    Hives   Alendronate Nausea And Vomiting and Nausea Only    GI intolerance    Chief Complaint  Patient presents with   Medical Management of Chronic Issues          Vascular dementia with behavioral disturbance/mood disorder: Mixed hyperlipidemia:  Glucose intolerance:      HPI:  She is a 73 year old long term resident of this facility being seen for the management of her chronic illnesses: Vascular dementia with behavioral disturbance/mood disorder: Mixed hyperlipidemia:  Glucose intolerance. There are no reports of uncontrolled pain. There are no recent reports of behavioral disturbances; her weight is stable   Past Medical History:  Diagnosis Date   Difficulty in walking    per [facility]   Falls    MAR   Glucose intolerance    Goiter, non-toxic    per [facility]   Hyperlipidemia    Hypokalemia 10/10/2023   Mood disorder (HCC)    per [facility] encounter date 10/28/23   Rhabdomyolysis 10/10/2023   Sepsis secondary to UTI (HCC) 10/10/2023   Venous stasis     Past Surgical History:  Procedure Laterality Date   AUGMENTATION MAMMAPLASTY Bilateral 2013-14   Mentor, In front of muscle. Bilateral lift    Social History   Socioeconomic History   Marital status: Married    Spouse name: Not on file   Number of children: Not on file   Years of education: Not on file   Highest education level: Not on file  Occupational History   Not on file  Tobacco Use   Smoking status: Never   Smokeless tobacco: Never  Substance and Sexual Activity   Alcohol use: Not Currently   Drug use: Never   Sexual activity: Not Currently  Other Topics Concern   Not on file  Social History Narrative   Not on file   Social Drivers of Health    Financial Resource Strain: Low Risk  (06/21/2023)   Received from [facility]   Overall Financial Resource Strain (CARDIA)    Difficulty of Paying Living Expenses: Not very hard  Food Insecurity: No Food Insecurity (10/10/2023)   Hunger Vital Sign    Worried About Running Out of Food in the Last Year: Never true    Ran Out of Food in the Last Year: Never true  Transportation Needs: No Transportation Needs (10/10/2023)   PRAPARE - Administrator, Civil Service (Medical): No    Lack of Transportation (Non-Medical): No  Physical Activity: Not on file  Stress: Not on file  Social Connections: Unknown (10/04/2023)   Received [facility]   Social Network    Social Network: Not on file  Intimate Partner Violence: Not At Risk (10/10/2023)   Humiliation, Afraid, Rape, and Kick questionnaire    Fear of Current or Ex-Partner: No    Emotionally Abused: No    Physically Abused: No    Sexually Abused: No   Family History  Problem Relation Age of Onset   Breast cancer Mother 75      VITAL SIGNS BP 130/77   Pulse 60   Temp (!) 97 F (36.1 C)   Resp 20   Ht 4'  9" (1.448 m)   Wt 138 lb (62.6 kg)   SpO2 97%   BMI 29.86 kg/m   Outpatient Encounter Medications as of 02/20/2024  Medication Sig   acetaminophen (TYLENOL) 325 MG tablet Take 650 mg by mouth every 6 (six) hours as needed.   divalproex (DEPAKOTE) 125 MG DR tablet Take 1 tablet (125 mg total) by mouth 3 (three) times daily.   fluticasone (FLONASE) 50 MCG/ACT nasal spray Place 2 sprays into both nostrils daily.   furosemide (LASIX) 20 MG tablet Take 20 mg by mouth daily.   melatonin 5 MG TABS Take 5 mg by mouth at bedtime.   meloxicam (MOBIC) 15 MG tablet Take 15 mg by mouth daily.   polyethylene glycol (MIRALAX / GLYCOLAX) 17 g packet Take 17 g by mouth daily as needed for mild constipation.   No facility-administered encounter medications on file as of 02/20/2024.     SIGNIFICANT DIAGNOSTIC  EXAMS  PREVIOUS   10-10-23: wbc 13.8; hgb 17.9; hct 51.5; mcv 87.6 plt 396; glucose 131; bun 43; creat 1.08; k+ 2.4; na++ 135; ca 9.0; gfr 55; protein 7.3 albumin 3.7 urine culture: e-coli: cipro 10-11-23: tsh 3.452 free t4: 1.47 10-14-23: wbc 7.3; hgb 13.0; hct 38.8; mcv 91.3 plt 285; glucose 115; bun 23; creat 0.73; k+ 3.8; na++ 134; ca 8.7; gfr >60 11-03-23: wbc 5.3; hgb 11.6; hct 34.9; mcv 93.1 plt 348; glucose 109; bun 16; creat 0.60; k+ 3.6; na++ 137; ca 9.3 gfr >60; protein 6.6 albumin 3.6   NO NEW LABS.   Review of Systems  Constitutional:  Negative for malaise/fatigue.  Respiratory:  Negative for cough and shortness of breath.   Cardiovascular:  Negative for chest pain, palpitations and leg swelling.  Gastrointestinal:  Negative for abdominal pain, constipation and heartburn.  Musculoskeletal:  Negative for back pain, joint pain and myalgias.  Skin: Negative.   Neurological:  Negative for dizziness.  Psychiatric/Behavioral:  The patient is not nervous/anxious.    Physical Exam Constitutional:      General: She is not in acute distress.    Appearance: She is well-developed. She is not diaphoretic.  Neck:     Thyroid: No thyromegaly.  Cardiovascular:     Rate and Rhythm: Normal rate and regular rhythm.     Heart sounds: Murmur heard.  Pulmonary:     Effort: Pulmonary effort is normal. No respiratory distress.     Breath sounds: Normal breath sounds.  Abdominal:     General: Bowel sounds are normal. There is no distension.     Palpations: Abdomen is soft.     Tenderness: There is no abdominal tenderness.  Musculoskeletal:        General: Normal range of motion.     Cervical back: Neck supple.     Right lower leg: Edema present.     Left lower leg: Edema present.  Lymphadenopathy:     Cervical: No cervical adenopathy.  Skin:    General: Skin is warm and dry.  Neurological:     Mental Status: She is alert. Mental status is at baseline.     Comments: BCAT 10-26-23:  24/50    Psychiatric:        Mood and Affect: Mood normal.      ASSESSMENT/ PLAN:  TODAY  Vascular dementia with behavioral disturbance/mood disorder: weight is 138 pounds; will continue depakote 125 mg three times weekly to stabilize mood.   2. Mixed hyperlipidemia: chol 235 ldl 127; will continue lipitor 10 mg daily  3. Glucose intolerance: hgb A1c 5.5  PREVIOUS   4. Vaginal atrophy: estrace 1 gm three times weekly   5. Goiter: mild elevated fre t4: 1.47 will monitor  Synthia Innocent NP [facility] Adult Medicine   call [phone]

## 2024-03-04 ENCOUNTER — Encounter (HOSPITAL_COMMUNITY)
Admission: RE | Admit: 2024-03-04 | Discharge: 2024-03-04 | Disposition: A | Discharge status: Home / Self Care | Source: Skilled Nursing Facility | Attending: Adult Health | Admitting: Adult Health

## 2024-03-04 DIAGNOSIS — Z1383 Encounter for screening for respiratory disorder NEC: Secondary | ICD-10-CM | POA: Insufficient documentation

## 2024-03-04 LAB — RESP PANEL BY RT-PCR (RSV, FLU A&B, COVID)  RVPGX2
Influenza A by PCR: POSITIVE — AB
Influenza B by PCR: NEGATIVE
Resp Syncytial Virus by PCR: NEGATIVE
SARS Coronavirus 2 by RT PCR: NEGATIVE

## 2024-03-14 ENCOUNTER — Non-Acute Institutional Stay (SKILLED_NURSING_FACILITY): Payer: Self-pay | Admitting: Adult Health

## 2024-03-14 DIAGNOSIS — E049 Nontoxic goiter, unspecified: Secondary | ICD-10-CM

## 2024-03-14 DIAGNOSIS — E7439 Other disorders of intestinal carbohydrate absorption: Secondary | ICD-10-CM | POA: Diagnosis not present

## 2024-03-14 DIAGNOSIS — N952 Postmenopausal atrophic vaginitis: Secondary | ICD-10-CM | POA: Diagnosis not present

## 2024-03-14 NOTE — Progress Notes (Signed)
 Location:  [facility] Room Number: [id number] Place of Service:  SNF (31)   CODE STATUS: dnr   Allergies  Allergen Reactions   Penicillins Hives, Rash and Other (See Comments)    Childhood allergy   Penciclovir Rash    Hives   Alendronate Nausea And Vomiting and Nausea Only    GI intolerance    Chief Complaint  Patient presents with   Medical Management of Chronic Issues         Glucose intolerance: Vaginal atrophy:  Goiter     HPI:  She is  73 year old long term resident of this facility being seen for the management of her chronic illnesses: Glucose intolerance: Vaginal atrophy:  Goiter. There are no reports of uncontrolled pain. She continues to have bilateral lower extremity edema. No reports of agitation present.   Past Medical History:  Diagnosis Date   Difficulty in walking    per [facility]   Falls    MAR   Glucose intolerance    Goiter, non-toxic    per [facility]   Hyperlipidemia    Hypokalemia 10/10/2023   Mood disorder (HCC)    per [facility] date 10/28/23   Rhabdomyolysis 10/10/2023   Sepsis secondary to UTI (HCC) 10/10/2023   Venous stasis     Past Surgical History:  Procedure Laterality Date   AUGMENTATION MAMMAPLASTY Bilateral 2013-14   Mentor, In front of muscle. Bilateral lift    Social History   Socioeconomic History   Marital status: Married    Spouse name: Not on file   Number of children: Not on file   Years of education: Not on file   Highest education level: Not on file  Occupational History   Not on file  Tobacco Use   Smoking status: Never   Smokeless tobacco: Never  Substance and Sexual Activity   Alcohol use: Not Currently   Drug use: Never   Sexual activity: Not Currently  Other Topics Concern   Not on file  Social History Narrative   Not on file   Social Drivers of Health   Financial Resource Strain: Low Risk  (06/21/2023)   Received from [facility]   Overall Financial Resource  Strain (CARDIA)    Difficulty of Paying Living Expenses: Not very hard  Food Insecurity: No Food Insecurity (10/10/2023)   Hunger Vital Sign    Worried About Running Out of Food in the Last Year: Never true    Ran Out of Food in the Last Year: Never true  Transportation Needs: No Transportation Needs (10/10/2023)   PRAPARE - Administrator, Civil Service (Medical): No    Lack of Transportation (Non-Medical): No  Physical Activity: Not on file  Stress: Not on file  Social Connections: Unknown (10/04/2023)   Received from [facility]   Social Network    Social Network: Not on file  Intimate Partner Violence: Not At Risk (10/10/2023)   Humiliation, Afraid, Rape, and Kick questionnaire    Fear of Current or Ex-Partner: No    Emotionally Abused: No    Physically Abused: No    Sexually Abused: No   Family History  Problem Relation Age of Onset   Breast cancer Mother 80      VITAL SIGNS BP (!) 143/64   Pulse 67   Temp 97.7 F (36.5 C)   Resp 18   Ht 4\' 9"  (1.448 m)   Wt 136 lb 12.8 oz (62.1  kg)   SpO2 97%   BMI 29.60 kg/m   Outpatient Encounter Medications as of 03/14/2024  Medication Sig   acetaminophen (TYLENOL) 325 MG tablet Take 650 mg by mouth every 6 (six) hours as needed.   divalproex (DEPAKOTE) 125 MG DR tablet Take 1 tablet (125 mg total) by mouth 3 (three) times daily.   fluticasone (FLONASE) 50 MCG/ACT nasal spray Place 2 sprays into both nostrils daily.   furosemide (LASIX) 20 MG tablet Take 20 mg by mouth daily.   melatonin 5 MG TABS Take 5 mg by mouth at bedtime.   meloxicam (MOBIC) 15 MG tablet Take 15 mg by mouth daily.   polyethylene glycol (MIRALAX / GLYCOLAX) 17 g packet Take 17 g by mouth daily as needed for mild constipation.   No facility-administered encounter medications on file as of 03/14/2024.     SIGNIFICANT DIAGNOSTIC EXAMS  PREVIOUS   10-10-23: wbc 13.8; hgb 17.9; hct 51.5; mcv 87.6 plt 396; glucose 131; bun 43; creat 1.08;  k+ 2.4; na++ 135; ca 9.0; gfr 55; protein 7.3 albumin 3.7 urine culture: e-coli: cipro 10-11-23: tsh 3.452 free t4: 1.47 10-14-23: wbc 7.3; hgb 13.0; hct 38.8; mcv 91.3 plt 285; glucose 115; bun 23; creat 0.73; k+ 3.8; na++ 134; ca 8.7; gfr >60 11-03-23: wbc 5.3; hgb 11.6; hct 34.9; mcv 93.1 plt 348; glucose 109; bun 16; creat 0.60; k+ 3.6; na++ 137; ca 9.3 gfr >60; protein 6.6 albumin 3.6   NO NEW LABS.   Review of Systems  Constitutional:  Negative for malaise/fatigue.  Respiratory:  Negative for cough and shortness of breath.   Cardiovascular:  Negative for chest pain, palpitations and leg swelling.  Gastrointestinal:  Negative for abdominal pain, constipation and heartburn.  Musculoskeletal:  Negative for back pain, joint pain and myalgias.  Skin: Negative.   Neurological:  Negative for dizziness.  Psychiatric/Behavioral:  The patient is not nervous/anxious.    Physical Exam Constitutional:      General: She is not in acute distress.    Appearance: She is well-developed. She is not diaphoretic.  Neck:     Thyroid: Thyromegaly present.  Cardiovascular:     Rate and Rhythm: Normal rate and regular rhythm.     Pulses: Normal pulses.     Heart sounds: Murmur heard.  Pulmonary:     Effort: Pulmonary effort is normal. No respiratory distress.     Breath sounds: Normal breath sounds.  Abdominal:     General: Bowel sounds are normal. There is no distension.     Palpations: Abdomen is soft.     Tenderness: There is no abdominal tenderness.  Musculoskeletal:        General: Normal range of motion.     Cervical back: Neck supple.     Right lower leg: Edema present.     Left lower leg: Edema present.  Lymphadenopathy:     Cervical: No cervical adenopathy.  Skin:    General: Skin is warm and dry.  Neurological:     Mental Status: She is alert. Mental status is at baseline.     Comments: BCAT 10-26-23: 24/50  Psychiatric:        Mood and Affect: Mood normal.      ASSESSMENT/  PLAN:  TODAY  Glucose intolerance: hgb A1c 5.5  2. Vaginal atrophy: estrace 1 gm three times weekly  3. Goiter: mildly elevated free t4: 1.47 will monitor   PREVIOUS   4. Vascular dementia with behavioral disturbance/mood disorder: weight is 136  pounds; will continue depakote 125 mg three times weekly to stabilize mood.   5. Mixed hyperlipidemia: chol 235 ldl 127; will continue lipitor 10 mg daily       Synthia Innocent NP [facility] Adult Medicine   call [phone]

## 2024-04-09 ENCOUNTER — Other Ambulatory Visit (HOSPITAL_COMMUNITY)
Admission: RE | Admit: 2024-04-09 | Discharge: 2024-04-09 | Disposition: A | Discharge status: Home / Self Care | Source: Skilled Nursing Facility | Attending: Internal Medicine | Admitting: Internal Medicine

## 2024-04-09 DIAGNOSIS — E876 Hypokalemia: Secondary | ICD-10-CM | POA: Insufficient documentation

## 2024-04-09 LAB — CBC WITH DIFFERENTIAL/PLATELET
Abs Immature Granulocytes: 0.01 10*3/uL (ref 0.00–0.07)
Basophils Absolute: 0.1 10*3/uL (ref 0.0–0.1)
Basophils Relative: 2 %
Eosinophils Absolute: 0.1 10*3/uL (ref 0.0–0.5)
Eosinophils Relative: 2 %
HCT: 35.7 % — ABNORMAL LOW (ref 36.0–46.0)
Hemoglobin: 12 g/dL (ref 12.0–15.0)
Immature Granulocytes: 0 %
Lymphocytes Relative: 36 %
Lymphs Abs: 2.2 10*3/uL (ref 0.7–4.0)
MCH: 30.1 pg (ref 26.0–34.0)
MCHC: 33.6 g/dL (ref 30.0–36.0)
MCV: 89.5 fL (ref 80.0–100.0)
Monocytes Absolute: 0.7 10*3/uL (ref 0.1–1.0)
Monocytes Relative: 12 %
Neutro Abs: 3.1 10*3/uL (ref 1.7–7.7)
Neutrophils Relative %: 48 %
Platelets: 304 10*3/uL (ref 150–400)
RBC: 3.99 MIL/uL (ref 3.87–5.11)
RDW: 14.1 % (ref 11.5–15.5)
WBC: 6.1 10*3/uL (ref 4.0–10.5)
nRBC: 0 % (ref 0.0–0.2)

## 2024-04-09 LAB — COMPREHENSIVE METABOLIC PANEL WITH GFR
ALT: 11 U/L (ref 0–44)
AST: 18 U/L (ref 15–41)
Albumin: 3.5 g/dL (ref 3.5–5.0)
Alkaline Phosphatase: 44 U/L (ref 38–126)
Anion gap: 9 (ref 5–15)
BUN: 19 mg/dL (ref 8–23)
CO2: 27 mmol/L (ref 22–32)
Calcium: 9.3 mg/dL (ref 8.9–10.3)
Chloride: 97 mmol/L — ABNORMAL LOW (ref 98–111)
Creatinine, Ser: 0.64 mg/dL (ref 0.44–1.00)
GFR, Estimated: >60 mL/min (ref >60)
Glucose, Bld: 104 mg/dL — ABNORMAL HIGH (ref 70–99)
Potassium: 4.2 mmol/L (ref 3.5–5.1)
Sodium: 133 mmol/L — ABNORMAL LOW (ref 135–145)
Total Bilirubin: 0.7 mg/dL (ref 0.0–1.2)
Total Protein: 7.4 g/dL (ref 6.5–8.1)

## 2024-04-11 ENCOUNTER — Non-Acute Institutional Stay (SKILLED_NURSING_FACILITY): Payer: Self-pay | Admitting: Adult Health

## 2024-04-11 ENCOUNTER — Encounter: Payer: Self-pay | Admitting: Adult Health

## 2024-04-11 DIAGNOSIS — F22 Delusional disorders: Secondary | ICD-10-CM | POA: Insufficient documentation
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
## 2024-04-12 ENCOUNTER — Encounter: Payer: Self-pay | Admitting: Internal Medicine

## 2024-04-12 ENCOUNTER — Non-Acute Institutional Stay (SKILLED_NURSING_FACILITY): Admitting: Internal Medicine

## 2024-04-12 ENCOUNTER — Other Ambulatory Visit (HOSPITAL_COMMUNITY): Payer: Self-pay | Admitting: Internal Medicine

## 2024-04-12 DIAGNOSIS — F39 Unspecified mood [affective] disorder: Secondary | ICD-10-CM

## 2024-04-12 DIAGNOSIS — R600 Localized edema: Secondary | ICD-10-CM

## 2024-04-12 DIAGNOSIS — E049 Nontoxic goiter, unspecified: Secondary | ICD-10-CM | POA: Diagnosis not present

## 2024-04-12 DIAGNOSIS — R011 Cardiac murmur, unspecified: Secondary | ICD-10-CM
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
## 2024-04-12 NOTE — Assessment & Plan Note (Addendum)
 Tense edema of bilateral lower extremities present.  Resting tachycardia with grade 3-4 systolic murmur.BNP ,TSH & ECHO will be updated.

## 2024-04-12 NOTE — Patient Instructions (Addendum)
 See assessment and plan under each diagnosis in the problem list and acutely for this visit :  Mood disorder (HCC) See 04/12/2024 encounter concerning nigfhtmares & fear of physical harm by unidentified individuals; Rexulti ordered.  Bilateral lower extremity edema Tense edema of bilateral lower extremities present.  Resting tachycardia with grade 3-4 systolic murmur.BNP ,TSH & ECHO will be updated.   Goiter I can not appreciate a goiter. Resting tachycardia & slight hyperreflexia noted on exam. TSH will be updated.

## 2024-04-12 NOTE — Assessment & Plan Note (Addendum)
 I can not appreciate a goiter. Resting tachycardia & slight hyperreflexia noted on exam. TSH will be updated.

## 2024-04-13 ENCOUNTER — Other Ambulatory Visit (HOSPITAL_COMMUNITY)
Admission: RE | Admit: 2024-04-13 | Discharge: 2024-04-13 | Disposition: A | Discharge status: Home / Self Care | Source: Skilled Nursing Facility | Attending: Internal Medicine | Admitting: Internal Medicine

## 2024-04-13 ENCOUNTER — Ambulatory Visit (HOSPITAL_COMMUNITY)
Admission: RE | Admit: 2024-04-13 | Discharge: 2024-04-13 | Disposition: A | Discharge status: Home / Self Care | Source: Ambulatory Visit | Attending: Internal Medicine | Admitting: Internal Medicine

## 2024-04-13 DIAGNOSIS — R000 Tachycardia, unspecified: Secondary | ICD-10-CM | POA: Diagnosis not present

## 2024-04-13 DIAGNOSIS — R011 Cardiac murmur, unspecified: Secondary | ICD-10-CM | POA: Insufficient documentation

## 2024-04-13 DIAGNOSIS — I351 Nonrheumatic aortic (valve) insufficiency: Secondary | ICD-10-CM | POA: Insufficient documentation

## 2024-04-13 DIAGNOSIS — I517 Cardiomegaly: Secondary | ICD-10-CM | POA: Insufficient documentation

## 2024-04-13 DIAGNOSIS — R600 Localized edema: Secondary | ICD-10-CM | POA: Insufficient documentation

## 2024-04-13 DIAGNOSIS — E785 Hyperlipidemia, unspecified: Secondary | ICD-10-CM | POA: Diagnosis not present

## 2024-04-13 DIAGNOSIS — E049 Nontoxic goiter, unspecified: Secondary | ICD-10-CM | POA: Insufficient documentation

## 2024-04-13 LAB — ECHOCARDIOGRAM COMPLETE
AR max vel: 0.77 cm2
AV Area VTI: 0.88 cm2
AV Area mean vel: 0.69 cm2
AV Mean grad: 29.5 mmHg
AV Peak grad: 57.5 mmHg
Ao pk vel: 3.79 m/s
Area-P 1/2: 4.6 cm2
MV VTI: 1.98 cm2
S' Lateral: 2.9 cm
Single Plane A4C EF: 52.9 %

## 2024-04-13 LAB — TSH: TSH: 3.293 u[IU]/mL (ref 0.350–4.500)

## 2024-04-13 LAB — BRAIN NATRIURETIC PEPTIDE: B Natriuretic Peptide: 89 pg/mL (ref 0.0–100.0)

## 2024-04-13 NOTE — Progress Notes (Signed)
*  PRELIMINARY RESULTS* Echocardiogram 2D Echocardiogram has been performed.  Silvana Drones 04/13/2024, 1:57 PM

## 2024-04-20 ENCOUNTER — Non-Acute Institutional Stay (SKILLED_NURSING_FACILITY): Payer: Self-pay | Admitting: Adult Health

## 2024-04-20 ENCOUNTER — Encounter: Payer: Self-pay | Admitting: Adult Health

## 2024-04-20 DIAGNOSIS — F01518 Vascular dementia, unspecified severity, with other behavioral disturbance: Secondary | ICD-10-CM | POA: Diagnosis not present

## 2024-04-20 DIAGNOSIS — F39 Unspecified mood [affective] disorder: Secondary | ICD-10-CM

## 2024-04-20 DIAGNOSIS — E049 Nontoxic goiter, unspecified: Secondary | ICD-10-CM | POA: Diagnosis not present

## 2024-04-20 NOTE — Progress Notes (Signed)
 Location:  [facility] Room Number: [id number] Place of Service:  SNF (31)   CODE STATUS: dnr   Allergies  Allergen Reactions   Penicillins Hives, Rash and Other (See Comments)    Childhood allergy   Penciclovir Rash    Hives   Alendronate Nausea And Vomiting and Nausea Only    GI intolerance    Chief Complaint  Patient presents with   Acute Visit    Behavioral issues     HPI:  She continues to have delusions and hallucinations. She has attempted to get out of the facility. Her delusions are getting worse. She has stated that she has had surgery against her will. She was started on rexulti. The staff feels as though the issues are getting worse since starting this medication.   Past Medical History:  Diagnosis Date   Difficulty in walking    per [facility]   Falls    MAR   Glucose intolerance    Goiter, non-toxic    per [facility]   Hyperlipidemia    Hypokalemia 10/10/2023   Mood disorder (HCC)    per [facility] encounter date 10/28/23   Rhabdomyolysis 10/10/2023   Sepsis secondary to UTI (HCC) 10/10/2023   Venous stasis     Past Surgical History:  Procedure Laterality Date   AUGMENTATION MAMMAPLASTY Bilateral 2013-14   Mentor, In front of muscle. Bilateral lift    Social History   Socioeconomic History   Marital status: Married    Spouse name: Not on file   Number of children: Not on file   Years of education: Not on file   Highest education level: Not on file  Occupational History   Not on file  Tobacco Use   Smoking status: Never   Smokeless tobacco: Never  Substance and Sexual Activity   Alcohol use: Not Currently   Drug use: Never   Sexual activity: Not Currently  Other Topics Concern   Not on file  Social History Narrative   Not on file   Social Drivers of Health   Financial Resource Strain: Low Risk  (06/21/2023)   Received from [facility]   Overall Financial Resource Strain (CARDIA)    Difficulty of Paying Living  Expenses: Not very hard  Food Insecurity: No Food Insecurity (10/10/2023)   Hunger Vital Sign    Worried About Running Out of Food in the Last Year: Never true    Ran Out of Food in the Last Year: Never true  Transportation Needs: No Transportation Needs (10/10/2023)   PRAPARE - Administrator, Civil Service (Medical): No    Lack of Transportation (Non-Medical): No  Physical Activity: Not on file  Stress: Not on file  Social Connections: Unknown (10/04/2023)   Received from [facility]   Social Network    Social Network: Not on file  Intimate Partner Violence: Not At Risk (10/10/2023)   Humiliation, Afraid, Rape, and Kick questionnaire    Fear of Current or Ex-Partner: No    Emotionally Abused: No    Physically Abused: No    Sexually Abused: No   Family History  Problem Relation Age of Onset   Breast cancer Mother 75      VITAL SIGNS BP 128/62   Pulse 78   Temp 98.3 F (36.8 C)   Resp 18   Ht 4\' 9"  (1.448 m)   Wt 140 lb 12.8 oz (63.9 kg)   SpO2 96%   BMI 30.47 kg/m  Outpatient Encounter Medications as of 04/20/2024  Medication Sig   acetaminophen  (TYLENOL ) 325 MG tablet Take 650 mg by mouth every 6 (six) hours as needed.   brexpiprazole (REXULTI) 0.25 MG TABS tablet Take 0.25 mg by mouth daily.   divalproex  (DEPAKOTE ) 125 MG DR tablet Take 1 tablet (125 mg total) by mouth 3 (three) times daily.   fluticasone (FLONASE) 50 MCG/ACT nasal spray Place 2 sprays into both nostrils daily.   furosemide  (LASIX ) 20 MG tablet Take 20 mg by mouth daily.   melatonin 5 MG TABS Take 5 mg by mouth at bedtime.   meloxicam (MOBIC) 15 MG tablet Take 15 mg by mouth daily.   polyethylene glycol (MIRALAX  / GLYCOLAX ) 17 g packet Take 17 g by mouth daily as needed for mild constipation.   No facility-administered encounter medications on file as of 04/20/2024.     SIGNIFICANT DIAGNOSTIC EXAMS  PREVIOUS   10-10-23: wbc 13.8; hgb 17.9; hct 51.5; mcv 87.6 plt 396; glucose  131; bun 43; creat 1.08; k+ 2.4; na++ 135; ca 9.0; gfr 55; protein 7.3 albumin 3.7 urine culture: e-coli: cipro 10-11-23: tsh 3.452 free t4: 1.47 10-14-23: wbc 7.3; hgb 13.0; hct 38.8; mcv 91.3 plt 285; glucose 115; bun 23; creat 0.73; k+ 3.8; na++ 134; ca 8.7; gfr >60 11-03-23: wbc 5.3; hgb 11.6; hct 34.9; mcv 93.1 plt 348; glucose 109; bun 16; creat 0.60; k+ 3.6; na++ 137; ca 9.3 gfr >60; protein 6.6 albumin 3.6   NO NEW LABS  Review of Systems  Constitutional:  Negative for malaise/fatigue.  Respiratory:  Negative for cough and shortness of breath.   Cardiovascular:  Negative for chest pain, palpitations and leg swelling.  Gastrointestinal:  Negative for abdominal pain, constipation and heartburn.  Musculoskeletal:  Negative for back pain, joint pain and myalgias.  Skin: Negative.   Neurological:  Negative for dizziness.  Psychiatric/Behavioral:  Positive for depression. Negative for suicidal ideas. The patient is nervous/anxious and has insomnia.     Physical Exam Constitutional:      General: She is not in acute distress.    Appearance: She is well-developed. She is not diaphoretic.  Neck:     Thyroid : Thyromegaly present.  Cardiovascular:     Rate and Rhythm: Normal rate and regular rhythm.     Pulses: Normal pulses.     Heart sounds: Murmur heard.  Pulmonary:     Effort: Pulmonary effort is normal. No respiratory distress.     Breath sounds: Normal breath sounds.  Abdominal:     General: Bowel sounds are normal. There is no distension.     Palpations: Abdomen is soft.     Tenderness: There is no abdominal tenderness.  Musculoskeletal:        General: Normal range of motion.     Cervical back: Neck supple.     Right lower leg: No edema.     Left lower leg: No edema.  Lymphadenopathy:     Cervical: No cervical adenopathy.  Skin:    General: Skin is warm and dry.  Neurological:     Mental Status: She is alert. Mental status is at baseline.  Psychiatric:        Mood  and Affect: Mood normal.     ASSESSMENT/ PLAN:  TODAY  Goiter: is without change Vascular dementia with behavioral disturbance Mood disturbance  Will stop rexulti and will begin seroquel 12.5 mg nightly    Britt Candle NP [facility]  call [phone]

## 2024-04-27 ENCOUNTER — Non-Acute Institutional Stay (SKILLED_NURSING_FACILITY): Payer: Self-pay | Admitting: Adult Health

## 2024-04-27 ENCOUNTER — Encounter: Payer: Self-pay | Admitting: Adult Health

## 2024-04-27 DIAGNOSIS — F22 Delusional disorders: Secondary | ICD-10-CM

## 2024-04-27 DIAGNOSIS — F39 Unspecified mood [affective] disorder: Secondary | ICD-10-CM

## 2024-04-27 DIAGNOSIS — E049 Nontoxic goiter, unspecified: Secondary | ICD-10-CM

## 2024-04-27 DIAGNOSIS — F01518 Vascular dementia, unspecified severity, with other behavioral disturbance: Secondary | ICD-10-CM

## 2024-04-27 NOTE — Progress Notes (Signed)
 Location:  [facility] Room Number: [id number] Place of Service:  SNF (31)   CODE STATUS: dnr   Allergies  Allergen Reactions   Penicillins Hives, Rash and Other (See Comments)    Childhood allergy   Penciclovir Rash    Hives   Alendronate Nausea And Vomiting and Nausea Only    GI intolerance    Chief Complaint  Patient presents with   Acute Visit    Care plan meeting     HPI:  We have come together for her care plan meeting. Family present . BIMS 7/15 mood 0/30. She has delusions: thinking that someone is trying to do surgery; people plotting to kill her; she is being kidnapped; calling police and exit seeking. She is using wheelchair without falls. She requires moderate assist with adl care. She is frequently incontinent of bladder and bowel. Dietary: regular diet feeds self; appetite 76-100%. Therapy: none at this time. She will continue to be followed for her chronic illnesses: Goiter Vascular dementia with behavioral disturbance  Mood disorder  Paranoia (psychosis)  Past Medical History:  Diagnosis Date   Difficulty in walking    per [facility]   Falls    MAR   Glucose intolerance    Goiter, non-toxic    per [facility]   Hyperlipidemia    Hypokalemia 10/10/2023   Mood disorder (HCC)    per [facility] encounter date 10/28/23   Rhabdomyolysis 10/10/2023   Sepsis secondary to UTI (HCC) 10/10/2023   Venous stasis     Past Surgical History:  Procedure Laterality Date   AUGMENTATION MAMMAPLASTY Bilateral 2013-14   Mentor, In front of muscle. Bilateral lift    Social History   Socioeconomic History   Marital status: Married    Spouse name: Not on file   Number of children: Not on file   Years of education: Not on file   Highest education level: Not on file  Occupational History   Not on file  Tobacco Use   Smoking status: Never   Smokeless tobacco: Never  Substance and Sexual Activity   Alcohol use: Not Currently   Drug use: Never   Sexual  activity: Not Currently  Other Topics Concern   Not on file  Social History Narrative   Not on file   Social Drivers of Health   Financial Resource Strain: Low Risk  (06/21/2023)   Received from [facility]   Overall Financial Resource Strain (CARDIA)    Difficulty of Paying Living Expenses: Not very hard  Food Insecurity: No Food Insecurity (10/10/2023)   Hunger Vital Sign    Worried About Running Out of Food in the Last Year: Never true    Ran Out of Food in the Last Year: Never true  Transportation Needs: No Transportation Needs (10/10/2023)   PRAPARE - Administrator, Civil Service (Medical): No    Lack of Transportation (Non-Medical): No  Physical Activity: Not on file  Stress: Not on file  Social Connections: Unknown (10/04/2023)   Received from [facility]   Social Network    Social Network: Not on file  Intimate Partner Violence: Not At Risk (10/10/2023)   Humiliation, Afraid, Rape, and Kick questionnaire    Fear of Current or Ex-Partner: No    Emotionally Abused: No    Physically Abused: No    Sexually Abused: No   Family History  Problem Relation Age of Onset   Breast cancer Mother 64  VITAL SIGNS BP (!) 147/73   Pulse 64   Temp (!) 97.4 F (36.3 C)   Resp 18   Ht 4\' 9"  (1.448 m)   Wt 141 lb 9.6 oz (64.2 kg)   SpO2 97%   BMI 30.64 kg/m   Outpatient Encounter Medications as of 04/27/2024  Medication Sig   acetaminophen  (TYLENOL ) 325 MG tablet Take 650 mg by mouth every 6 (six) hours as needed.   divalproex  (DEPAKOTE ) 125 MG DR tablet Take 1 tablet (125 mg total) by mouth 3 (three) times daily.   fluticasone (FLONASE) 50 MCG/ACT nasal spray Place 2 sprays into both nostrils daily.   furosemide  (LASIX ) 20 MG tablet Take 20 mg by mouth daily.   melatonin 5 MG TABS Take 5 mg by mouth at bedtime.   meloxicam (MOBIC) 15 MG tablet Take 15 mg by mouth daily.   polyethylene glycol (MIRALAX  / GLYCOLAX ) 17 g packet Take 17 g by mouth daily as  needed for mild constipation.   QUEtiapine (SEROQUEL) 25 MG tablet Take 25 mg by mouth at bedtime.   No facility-administered encounter medications on file as of 04/27/2024.     SIGNIFICANT DIAGNOSTIC EXAMS  PREVIOUS   10-10-23: wbc 13.8; hgb 17.9; hct 51.5; mcv 87.6 plt 396; glucose 131; bun 43; creat 1.08; k+ 2.4; na++ 135; ca 9.0; gfr 55; protein 7.3 albumin 3.7 urine culture: e-coli: cipro 10-11-23: tsh 3.452 free t4: 1.47 10-14-23: wbc 7.3; hgb 13.0; hct 38.8; mcv 91.3 plt 285; glucose 115; bun 23; creat 0.73; k+ 3.8; na++ 134; ca 8.7; gfr >60 11-03-23: wbc 5.3; hgb 11.6; hct 34.9; mcv 93.1 plt 348; glucose 109; bun 16; creat 0.60; k+ 3.6; na++ 137; ca 9.3 gfr >60; protein 6.6 albumin 3.6   NO NEW LABS  Review of Systems  Constitutional:  Negative for malaise/fatigue.  Respiratory:  Negative for cough and shortness of breath.   Cardiovascular:  Negative for chest pain, palpitations and leg swelling.  Gastrointestinal:  Negative for abdominal pain, constipation and heartburn.  Musculoskeletal:  Negative for back pain, joint pain and myalgias.  Skin: Negative.   Neurological:  Negative for dizziness.  Psychiatric/Behavioral:  The patient is not nervous/anxious.    Physical Exam Constitutional:      General: She is not in acute distress.    Appearance: She is well-developed. She is not diaphoretic.  Neck:     Thyroid : No thyromegaly.  Cardiovascular:     Rate and Rhythm: Normal rate and regular rhythm.     Pulses: Normal pulses.     Heart sounds: Normal heart sounds.  Pulmonary:     Effort: Pulmonary effort is normal. No respiratory distress.     Breath sounds: Normal breath sounds.  Abdominal:     General: Bowel sounds are normal. There is no distension.     Palpations: Abdomen is soft.     Tenderness: There is no abdominal tenderness.  Musculoskeletal:        General: Normal range of motion.     Cervical back: Neck supple.     Right lower leg: No edema.     Left  lower leg: No edema.  Lymphadenopathy:     Cervical: No cervical adenopathy.  Skin:    General: Skin is warm and dry.  Neurological:     Mental Status: She is alert. Mental status is at baseline.  Psychiatric:        Mood and Affect: Mood normal.      ASSESSMENT/ PLAN:  TODAY  Goiter Vascular dementia with behavioral disturbance Mood disorder Paranoia (psychosis)  Will increase seroquel to 25 mg twice daily Will continue current plan of care Will monitor her status.   Time spent with patient: 40 minutes behaviors; mood state; dietary.    Britt Candle NP [facility]   call [phone]

## 2024-05-01 ENCOUNTER — Encounter: Payer: Self-pay | Admitting: Internal Medicine

## 2024-05-01 DIAGNOSIS — I350 Nonrheumatic aortic (valve) stenosis: Secondary | ICD-10-CM | POA: Insufficient documentation

## 2024-05-14 ENCOUNTER — Encounter: Payer: Self-pay | Admitting: Adult Health

## 2024-05-14 ENCOUNTER — Non-Acute Institutional Stay (SKILLED_NURSING_FACILITY): Payer: Self-pay | Admitting: Adult Health

## 2024-05-14 DIAGNOSIS — M1711 Unilateral primary osteoarthritis, right knee: Secondary | ICD-10-CM | POA: Diagnosis not present

## 2024-05-14 DIAGNOSIS — F01518 Vascular dementia, unspecified severity, with other behavioral disturbance: Secondary | ICD-10-CM | POA: Diagnosis not present

## 2024-05-14 DIAGNOSIS — E782 Mixed hyperlipidemia: Secondary | ICD-10-CM | POA: Diagnosis not present

## 2024-05-14 NOTE — Progress Notes (Signed)
 Location:  [facility] Room Number: [id number] Place of Service:  SNF (31)   CODE STATUS: dnr   Allergies  Allergen Reactions   Penicillins Hives, Rash and Other (See Comments)    Childhood allergy   Penciclovir Rash    Hives   Alendronate Nausea And Vomiting and Nausea Only    GI intolerance    Chief Complaint  Patient presents with   Medical Management of Chronic Issues       Vascular dementia with behavioral disturbance and mood disorder:  Mixed hyperlipidemia: Right knee osteoarthritis:     HPI:   She is a 73 y.o. long term resident of this facility being seen for the management of her chronic illnesses: Vascular dementia with behavioral disturbance and mood disorder:  Mixed hyperlipidemia: Right knee osteoarthritis. There are no reports of uncontrolled pain. She continues to have delusions; which cause her anxiety and she has been calling 911 over the weekend. She has a psychology consult in June.    Past Medical History:  Diagnosis Date   Difficulty in walking    per [facility]   Falls    MAR   Glucose intolerance    Goiter, non-toxic    per [facility]   Hyperlipidemia    Hypokalemia 10/10/2023   Mood disorder (HCC)    per [facility] encounter date 10/28/23   Rhabdomyolysis 10/10/2023   Sepsis secondary to UTI (HCC) 10/10/2023   Venous stasis     Past Surgical History:  Procedure Laterality Date   AUGMENTATION MAMMAPLASTY Bilateral 2013-14   Mentor, In front of muscle. Bilateral lift    Social History   Socioeconomic History   Marital status: Married    Spouse name: Not on file   Number of children: Not on file   Years of education: Not on file   Highest education level: Not on file  Occupational History   Not on file  Tobacco Use   Smoking status: Never   Smokeless tobacco: Never  Substance and Sexual Activity   Alcohol use: Not Currently   Drug use: Never   Sexual activity: Not Currently  Other Topics Concern   Not on file   Social History Narrative   Not on file   Social Drivers of Health   Financial Resource Strain: Low Risk  (06/21/2023)   Received from [facility]   Overall Financial Resource Strain (CARDIA)    Difficulty of Paying Living Expenses: Not very hard  Food Insecurity: No Food Insecurity (10/10/2023)   Hunger Vital Sign    Worried About Running Out of Food in the Last Year: Never true    Ran Out of Food in the Last Year: Never true  Transportation Needs: No Transportation Needs (10/10/2023)   PRAPARE - Administrator, Civil Service (Medical): No    Lack of Transportation (Non-Medical): No  Physical Activity: Not on file  Stress: Not on file  Social Connections: Unknown (10/04/2023)   Received [facility]   Social Network    Social Network: Not on file  Intimate Partner Violence: Not At Risk (10/10/2023)   Humiliation, Afraid, Rape, and Kick questionnaire    Fear of Current or Ex-Partner: No    Emotionally Abused: No    Physically Abused: No    Sexually Abused: No   Family History  Problem Relation Age of Onset   Breast cancer Mother 50      VITAL SIGNS BP 138/74   Pulse 78  Temp 97.6 F (36.4 C)   Resp 20   Ht 4\' 9"  (1.448 m)   Wt 141 lb 9.6 oz (64.2 kg)   SpO2 97%   BMI 30.64 kg/m   Outpatient Encounter Medications as of 05/14/2024  Medication Sig   acetaminophen  (TYLENOL ) 325 MG tablet Take 650 mg by mouth every 6 (six) hours as needed.   divalproex  (DEPAKOTE ) 125 MG DR tablet Take 1 tablet (125 mg total) by mouth 3 (three) times daily.   fluticasone (FLONASE) 50 MCG/ACT nasal spray Place 2 sprays into both nostrils daily.   furosemide  (LASIX ) 20 MG tablet Take 20 mg by mouth daily.   melatonin 5 MG TABS Take 5 mg by mouth at bedtime.   meloxicam (MOBIC) 15 MG tablet Take 15 mg by mouth daily.   polyethylene glycol (MIRALAX  / GLYCOLAX ) 17 g packet Take 17 g by mouth daily as needed for mild constipation.   QUEtiapine (SEROQUEL) 25 MG tablet  Take 25 mg by mouth 2 (two) times daily.   No facility-administered encounter medications on file as of 05/14/2024.     SIGNIFICANT DIAGNOSTIC EXAMS  PREVIOUS   10-10-23: wbc 13.8; hgb 17.9; hct 51.5; mcv 87.6 plt 396; glucose 131; bun 43; creat 1.08; k+ 2.4; na++ 135; ca 9.0; gfr 55; protein 7.3 albumin 3.7 urine culture: e-coli: cipro 10-11-23: tsh 3.452 free t4: 1.47 10-14-23: wbc 7.3; hgb 13.0; hct 38.8; mcv 91.3 plt 285; glucose 115; bun 23; creat 0.73; k+ 3.8; na++ 134; ca 8.7; gfr >60 11-03-23: wbc 5.3; hgb 11.6; hct 34.9; mcv 93.1 plt 348; glucose 109; bun 16; creat 0.60; k+ 3.6; na++ 137; ca 9.3 gfr >60; protein 6.6 albumin 3.6   NO NEW LABS.   Review of Systems  Constitutional:  Negative for malaise/fatigue.  Respiratory:  Negative for cough and shortness of breath.   Cardiovascular:  Negative for chest pain, palpitations and leg swelling.  Gastrointestinal:  Negative for abdominal pain, constipation and heartburn.  Musculoskeletal:  Negative for back pain, joint pain and myalgias.  Skin: Negative.   Neurological:  Negative for dizziness.  Psychiatric/Behavioral:  The patient is not nervous/anxious.    Physical Exam Constitutional:      General: She is not in acute distress.    Appearance: She is well-developed. She is not diaphoretic.  Neck:     Thyroid : Thyromegaly present.  Cardiovascular:     Rate and Rhythm: Normal rate and regular rhythm.     Pulses: Normal pulses.     Heart sounds: Murmur heard.  Pulmonary:     Effort: Pulmonary effort is normal. No respiratory distress.     Breath sounds: Normal breath sounds.  Abdominal:     General: Bowel sounds are normal. There is no distension.     Palpations: Abdomen is soft.     Tenderness: There is no abdominal tenderness.  Musculoskeletal:        General: Normal range of motion.     Cervical back: Neck supple.     Right lower leg: Edema present.     Left lower leg: Edema present.  Lymphadenopathy:      Cervical: No cervical adenopathy.  Skin:    General: Skin is warm and dry.  Neurological:     Mental Status: She is alert. Mental status is at baseline.     Comments: BCAT 10-26-23: 24/50   Psychiatric:        Mood and Affect: Mood normal.      ASSESSMENT/ PLAN:  TODAY  Vascular dementia with behavioral disturbance and mood disorder: weight is 141 pounds; will continue depakote  125 mg three times daily to help stabilize mood  2. Mixed hyperlipidemia: ldl 127 is off statin  3. Right knee osteoarthritis: will continue mobic 15 mg daily for pain management   PREVIOUS   4. Chronic non-seasonal allergic rhinitis: will continue flonase daily  5. Bilateral lower extremity edema: will continue lasix  20 mg daily   6. Delusions and hallucinations: continues to have delusions for which she called 911 multiple times over the weekend will continue seroquel 25 mg twice daily  7. Glucose intolerance: hgb A1c 5.5  8. Goiter: mildly elevated free t4: 1.47 will monitor   9. Aortic stenosis: awaiting cardiology consult.     Britt Candle NP [facility]   call [phone]

## 2024-05-15 DIAGNOSIS — M1711 Unilateral primary osteoarthritis, right knee: Secondary | ICD-10-CM | POA: Insufficient documentation

## 2024-05-16 ENCOUNTER — Non-Acute Institutional Stay (SKILLED_NURSING_FACILITY): Payer: Self-pay | Admitting: Adult Health

## 2024-05-16 ENCOUNTER — Encounter: Payer: Self-pay | Admitting: Adult Health

## 2024-05-16 DIAGNOSIS — F01518 Vascular dementia, unspecified severity, with other behavioral disturbance: Secondary | ICD-10-CM | POA: Diagnosis not present

## 2024-05-16 DIAGNOSIS — F22 Delusional disorders: Secondary | ICD-10-CM | POA: Diagnosis not present

## 2024-05-16 DIAGNOSIS — R600 Localized edema: Secondary | ICD-10-CM

## 2024-05-16 DIAGNOSIS — F39 Unspecified mood [affective] disorder: Secondary | ICD-10-CM

## 2024-05-16 NOTE — Progress Notes (Signed)
 Location:  [facility] Room Number: 157-W Place of Service:  SNF (31) Provider: Britt Candle, NP   CODE STATUS: DNR  Allergies  Allergen Reactions   Penicillins Hives, Rash and Other (See Comments)    Childhood allergy   Penciclovir Rash    Hives   Alendronate Nausea And Vomiting and Nausea Only    GI intolerance    Chief Complaint  Patient presents with   Behavioral Issues    HPI:  She has been started on seroquel 25 mg twice daily for her delusions and paranoia. This medication has not been effective in managing her symptoms. She feels as though people are out to kill her; video taping her; kidnapping her; poisoning her. She has called the police up to 3 times in one night. We have discussed her with the care plan team. This medication is not effective and will need to change her medication.   Past Medical History:  Diagnosis Date   Difficulty in walking    per [facility]   Falls    MAR   Glucose intolerance    Goiter, non-toxic    per [facility]   Hyperlipidemia    Hypokalemia 10/10/2023   Mood disorder (HCC)    per [facility] encounter date 10/28/23   Rhabdomyolysis 10/10/2023   Sepsis secondary to UTI (HCC) 10/10/2023   Venous stasis     Past Surgical History:  Procedure Laterality Date   AUGMENTATION MAMMAPLASTY Bilateral 2013-14   Mentor, In front of muscle. Bilateral lift    Social History   Socioeconomic History   Marital status: Married    Spouse name: Not on file   Number of children: Not on file   Years of education: Not on file   Highest education level: Not on file  Occupational History   Not on file  Tobacco Use   Smoking status: Never   Smokeless tobacco: Never  Substance and Sexual Activity   Alcohol use: Not Currently   Drug use: Never   Sexual activity: Not Currently  Other Topics Concern   Not on file  Social History Narrative   Not on file   Social Drivers of Health   Financial Resource Strain: Low Risk   (06/21/2023)   Received from [facility]   Overall Financial Resource Strain (CARDIA)    Difficulty of Paying Living Expenses: Not very hard  Food Insecurity: No Food Insecurity (10/10/2023)   Hunger Vital Sign    Worried About Running Out of Food in the Last Year: Never true    Ran Out of Food in the Last Year: Never true  Transportation Needs: No Transportation Needs (10/10/2023)   PRAPARE - Administrator, Civil Service (Medical): No    Lack of Transportation (Non-Medical): No  Physical Activity: Not on file  Stress: Not on file  Social Connections: Unknown (10/04/2023)   Received from [facility]   Social Network    Social Network: Not on file  Intimate Partner Violence: Not At Risk (10/10/2023)   Humiliation, Afraid, Rape, and Kick questionnaire    Fear of Current or Ex-Partner: No    Emotionally Abused: No    Physically Abused: No    Sexually Abused: No   Family History  Problem Relation Age of Onset   Breast cancer Mother 14      VITAL SIGNS BP 138/74   Pulse 78   Temp 97.6 F (36.4 C)   Resp 20   Ht 4'  9" (1.448 m)   Wt 141 lb 9.6 oz (64.2 kg)   SpO2 97%   BMI 30.64 kg/m   Outpatient Encounter Medications as of 05/16/2024  Medication Sig   acetaminophen  (TYLENOL ) 325 MG tablet Take 650 mg by mouth every 6 (six) hours as needed.   divalproex  (DEPAKOTE ) 125 MG DR tablet Take 1 tablet (125 mg total) by mouth 3 (three) times daily.   furosemide  (LASIX ) 20 MG tablet Take 20 mg by mouth daily.   meloxicam (MOBIC) 15 MG tablet Take 15 mg by mouth daily.   OLANZapine (ZYPREXA) 5 MG tablet Take 5 mg by mouth every evening.   polyethylene glycol (MIRALAX  / GLYCOLAX ) 17 g packet Take 17 g by mouth daily as needed for mild constipation.   fluticasone (FLONASE) 50 MCG/ACT nasal spray Place 2 sprays into both nostrils daily.   melatonin 5 MG TABS Take 5 mg by mouth at bedtime. (Patient not taking: Reported on 05/16/2024)   QUEtiapine (SEROQUEL) 25 MG  tablet Take 25 mg by mouth 2 (two) times daily. (Patient not taking: Reported on 05/16/2024)   No facility-administered encounter medications on file as of 05/16/2024.     SIGNIFICANT DIAGNOSTIC EXAMS  PREVIOUS   10-10-23: wbc 13.8; hgb 17.9; hct 51.5; mcv 87.6 plt 396; glucose 131; bun 43; creat 1.08; k+ 2.4; na++ 135; ca 9.0; gfr 55; protein 7.3 albumin 3.7 urine culture: e-coli: cipro 10-11-23: tsh 3.452 free t4: 1.47 10-14-23: wbc 7.3; hgb 13.0; hct 38.8; mcv 91.3 plt 285; glucose 115; bun 23; creat 0.73; k+ 3.8; na++ 134; ca 8.7; gfr >60 11-03-23: wbc 5.3; hgb 11.6; hct 34.9; mcv 93.1 plt 348; glucose 109; bun 16; creat 0.60; k+ 3.6; na++ 137; ca 9.3 gfr >60; protein 6.6 albumin 3.6   NO NEW LABS.   Review of Systems  Constitutional:  Negative for malaise/fatigue.  Respiratory:  Negative for cough and shortness of breath.   Cardiovascular:  Negative for chest pain, palpitations and leg swelling.  Gastrointestinal:  Negative for abdominal pain, constipation and heartburn.  Musculoskeletal:  Negative for back pain, joint pain and myalgias.  Skin: Negative.   Neurological:  Negative for dizziness.  Psychiatric/Behavioral:  Negative for suicidal ideas. The patient is nervous/anxious.     Physical Exam Constitutional:      General: She is not in acute distress.    Appearance: She is well-developed. She is not diaphoretic.  Neck:     Thyroid : No thyromegaly.  Cardiovascular:     Rate and Rhythm: Normal rate and regular rhythm.     Pulses: Normal pulses.     Heart sounds: Normal heart sounds.  Pulmonary:     Effort: Pulmonary effort is normal. No respiratory distress.     Breath sounds: Normal breath sounds.  Abdominal:     General: Bowel sounds are normal. There is no distension.     Palpations: Abdomen is soft.     Tenderness: There is no abdominal tenderness.  Musculoskeletal:        General: Normal range of motion.     Cervical back: Neck supple.     Right lower leg:  Edema present.     Left lower leg: Edema present.  Lymphadenopathy:     Cervical: No cervical adenopathy.  Skin:    General: Skin is warm and dry.  Neurological:     Mental Status: She is alert. Mental status is at baseline.     Comments: BCAT 10-26-23: 24/50    Psychiatric:  Mood and Affect: Mood normal.     ASSESSMENT/ PLAN:  TODAY  Bilateral lower extremity edema: is without change Paranoia (psychosis); mood disorder; vascular dementia with behavioral disturbance: will stop seroquel, this medication is not effective; will begin zyprexa 5 mg every evening and will monitor her response     Britt Candle NP [facility]   call [phone]

## 2024-05-31 ENCOUNTER — Encounter: Payer: Self-pay | Admitting: Adult Health

## 2024-05-31 ENCOUNTER — Non-Acute Institutional Stay (SKILLED_NURSING_FACILITY): Payer: Self-pay | Admitting: Adult Health

## 2024-05-31 DIAGNOSIS — R600 Localized edema: Secondary | ICD-10-CM | POA: Diagnosis not present

## 2024-05-31 NOTE — Progress Notes (Unsigned)
 Location:  [facility] Room Number: 157 Place of Service:  SNF (31)   CODE STATUS: dnr   Allergies  Allergen Reactions   Penicillins Hives, Rash and Other (See Comments)    Childhood allergy   Penciclovir Rash    Hives   Alendronate Nausea And Vomiting and Nausea Only    GI intolerance    Chief Complaint  Patient presents with   Acute Visit    Lower extremity edema     HPI:  She is complaining of worsening lower extremity edema. She is complaining of bilateral feet pain. She is not using TED hose. She is taking lasix  20 mg daily. She has stated that she is willing to take a second dose of lasix  daily.   Past Medical History:  Diagnosis Date   Difficulty in walking    per [facility]   Falls    MAR   Glucose intolerance    Goiter, non-toxic    per [facility]   Hyperlipidemia    Hypokalemia 10/10/2023   Mood disorder (HCC)    [facility] date 10/28/23   Rhabdomyolysis 10/10/2023   Sepsis secondary to UTI (HCC) 10/10/2023   Venous stasis     Past Surgical History:  Procedure Laterality Date   AUGMENTATION MAMMAPLASTY Bilateral 2013-14   Mentor, In front of muscle. Bilateral lift    Social History   Socioeconomic History   Marital status: Married    Spouse name: Not on file   Number of children: Not on file   Years of education: Not on file   Highest education level: Not on file  Occupational History   Not on file  Tobacco Use   Smoking status: Never   Smokeless tobacco: Never  Substance and Sexual Activity   Alcohol use: Not Currently   Drug use: Never   Sexual activity: Not Currently  Other Topics Concern   Not on file  Social History Narrative   Not on file   Social Drivers of Health   Financial Resource Strain: Low Risk  (06/21/2023)   Received from [facility]   Overall Financial Resource Strain (CARDIA)    Difficulty of Paying Living Expenses: Not very hard  Food Insecurity: No Food Insecurity  (10/10/2023)   Hunger Vital Sign    Worried About Running Out of Food in the Last Year: Never true    Ran Out of Food in the Last Year: Never true  Transportation Needs: No Transportation Needs (10/10/2023)   PRAPARE - Administrator, Civil Service (Medical): No    Lack of Transportation (Non-Medical): No  Physical Activity: Not on file  Stress: Not on file  Social Connections: Unknown (10/04/2023)   Received from [facility]   Social Network    Social Network: Not on file  Intimate Partner Violence: Not At Risk (10/10/2023)   Humiliation, Afraid, Rape, and Kick questionnaire    Fear of Current or Ex-Partner: No    Emotionally Abused: No    Physically Abused: No    Sexually Abused: No   Family History  Problem Relation Age of Onset   Breast cancer Mother 66      VITAL SIGNS BP 135/71   Pulse 70   Temp (!) 97.1 F (36.2 C)   Resp 18   Ht 4\' 9"  (1.448 m)   Wt 146 lb 6.4 oz (66.4 kg)   SpO2 97%   BMI 31.68 kg/m   Outpatient Encounter Medications as  of 05/31/2024  Medication Sig   acetaminophen  (TYLENOL ) 325 MG tablet Take 650 mg by mouth every 6 (six) hours as needed.   divalproex  (DEPAKOTE ) 125 MG DR tablet Take 1 tablet (125 mg total) by mouth 3 (three) times daily.   fluticasone (FLONASE) 50 MCG/ACT nasal spray Place 2 sprays into both nostrils daily.   furosemide  (LASIX ) 20 MG tablet Take 20 mg by mouth daily.   meloxicam (MOBIC) 15 MG tablet Take 15 mg by mouth daily.   OLANZapine (ZYPREXA) 5 MG tablet Take 5 mg by mouth every evening.   polyethylene glycol (MIRALAX  / GLYCOLAX ) 17 g packet Take 17 g by mouth daily as needed for mild constipation.   No facility-administered encounter medications on file as of 05/31/2024.     SIGNIFICANT DIAGNOSTIC EXAMS  PREVIOUS   10-10-23: wbc 13.8; hgb 17.9; hct 51.5; mcv 87.6 plt 396; glucose 131; bun 43; creat 1.08; k+ 2.4; na++ 135; ca 9.0; gfr 55; protein 7.3 albumin 3.7 urine culture: e-coli: cipro 10-11-23:  tsh 3.452 free t4: 1.47 10-14-23: wbc 7.3; hgb 13.0; hct 38.8; mcv 91.3 plt 285; glucose 115; bun 23; creat 0.73; k+ 3.8; na++ 134; ca 8.7; gfr >60 11-03-23: wbc 5.3; hgb 11.6; hct 34.9; mcv 93.1 plt 348; glucose 109; bun 16; creat 0.60; k+ 3.6; na++ 137; ca 9.3 gfr >60; protein 6.6 albumin 3.6   TODAY  11-20-24: wbc 5.5; hgb 11.8; hct 35.4; mcv 92.9 plt 390; glucose 96; bun 13; creat 0.50; k+ 3.4; na++134; ca 8.6; gfr>60; protein 6.3 albumin 3.2 depakote  51 01-05-24: glucose 104; bun 13; creat 0.46; k+ 3.3; na++ 132; ca 8.9; gfr >60 01-10-24: glucose 108; bun 17; creat 0.51; k+ 3.6; na++ 133; ca 8.8; gfr >60 01-26-24: wbc 10.7; hgb 11.6; hgb 34.4; mcv 91.5 plt 289; 02-16-24: glucose 102; bun 20; creat 0.59; k+ 3.5; na++ 132; ca 8.9; gfr >60 04-09-24: wbc 6.1; hgb 12.0; hct 35.7; mcv 89.5 plt 304; glucose 104; bun 19; creat 0.64; k+ 4.2; na++ 133; ca 9.3 gfr >60; protein 7.4 albumin 3.5; tsh 3.293   Review of Systems  Constitutional:  Negative for malaise/fatigue.  Respiratory:  Negative for cough and shortness of breath.   Cardiovascular:  Positive for leg swelling. Negative for chest pain and palpitations.  Gastrointestinal:  Negative for abdominal pain, constipation and heartburn.  Musculoskeletal:  Negative for back pain, joint pain and myalgias.  Skin: Negative.   Neurological:  Negative for dizziness.  Psychiatric/Behavioral:  The patient is not nervous/anxious.    Physical Exam Constitutional:      General: She is not in acute distress.    Appearance: She is well-developed. She is not diaphoretic.  Neck:     Thyroid : No thyromegaly.  Cardiovascular:     Rate and Rhythm: Normal rate and regular rhythm.     Pulses: Normal pulses.     Heart sounds: Normal heart sounds.  Pulmonary:     Effort: Pulmonary effort is normal. No respiratory distress.     Breath sounds: Normal breath sounds.  Abdominal:     General: Bowel sounds are normal. There is no distension.     Palpations: Abdomen  is soft.     Tenderness: There is no abdominal tenderness.  Musculoskeletal:        General: Normal range of motion.     Cervical back: Neck supple.     Right lower leg: Edema present.     Left lower leg: Edema present.     Comments: 3+  Lymphadenopathy:     Cervical: No cervical adenopathy.  Skin:    General: Skin is warm and dry.  Neurological:     Mental Status: She is alert. Mental status is at baseline.  Psychiatric:        Mood and Affect: Mood normal.      ASSESSMENT/ PLAN:  TODAY  Bilateral lower extremity edema: will increase lasix  to 20 mg twice daily and will check labs on 06-07-24.    Britt Candle NP [facility]   call [phone]

## 2024-06-04 ENCOUNTER — Ambulatory Visit: Attending: Internal Medicine | Admitting: Internal Medicine

## 2024-06-04 ENCOUNTER — Encounter: Payer: Self-pay | Admitting: Internal Medicine

## 2024-06-04 VITALS — BP 124/78 | HR 73 | Wt 146.6 lb

## 2024-06-04 DIAGNOSIS — I350 Nonrheumatic aortic (valve) stenosis: Secondary | ICD-10-CM | POA: Insufficient documentation

## 2024-06-04 DIAGNOSIS — I351 Nonrheumatic aortic (valve) insufficiency: Secondary | ICD-10-CM | POA: Insufficient documentation

## 2024-06-04 DIAGNOSIS — Z136 Encounter for screening for cardiovascular disorders: Secondary | ICD-10-CM | POA: Diagnosis not present

## 2024-06-04 DIAGNOSIS — M7989 Other specified soft tissue disorders: Secondary | ICD-10-CM | POA: Diagnosis not present

## 2024-06-04 NOTE — Progress Notes (Signed)
 Cardiology Office Note  Date: 06/04/2024   ID: Lorenda, Grecco Jun 28, 1951, MRN [id number]  PCP:  Marilyne Shu, NP  Cardiologist:  Lasalle Pointer, MD Electrophysiologist:  None   History of Present Illness: TREONNA KLEE is a 73 y.o. female  Referred to cardiology clinic for management of valvular heart disease.  Echocardiogram in April 2025 showed normal LVEF, moderate to severe aortic valve stenosis and mild aortic valve regurgitation.  Patient is wheelchair-bound due to severe right hip arthritis.  Asymptomatic.  No DOE or angina.  No lightheadedness, dizziness, syncope, palpitations.  She does have leg swelling, her right leg is more swollen than the left leg.  Currently wrapped.  Past Medical History:  Diagnosis Date   Difficulty in walking    per [facility]   Falls    MAR   Glucose intolerance    Goiter, non-toxic    per [facility]   Hyperlipidemia    Hypokalemia 10/10/2023   Mood disorder (HCC)    [facility] date 10/28/23   Rhabdomyolysis 10/10/2023   Sepsis secondary to UTI (HCC) 10/10/2023   Venous stasis     Past Surgical History:  Procedure Laterality Date   AUGMENTATION MAMMAPLASTY Bilateral 2013-14   Mentor, In front of muscle. Bilateral lift    Current Outpatient Medications  Medication Sig Dispense Refill   acetaminophen  (TYLENOL ) 325 MG tablet Take 650 mg by mouth every 6 (six) hours as needed.     ammonium lactate (LAC-HYDRIN) 12 % lotion Apply 1 Application topically as needed for dry skin.     divalproex  (DEPAKOTE ) 125 MG DR tablet Take 1 tablet (125 mg total) by mouth 3 (three) times daily. 90 tablet 0   furosemide  (LASIX ) 20 MG tablet Take 20 mg by mouth 2 (two) times daily. 9 AM and 2 PM     melatonin 5 MG TABS Take 5 mg by mouth at bedtime as needed.     meloxicam (MOBIC) 15 MG tablet Take 15 mg by mouth daily.     OLANZapine (ZYPREXA) 7.5 MG tablet Take 7.5 mg by mouth every evening.     polyethylene glycol  (MIRALAX  / GLYCOLAX ) 17 g packet Take 17 g by mouth daily as needed for mild constipation. 14 each 0   QUEtiapine (SEROQUEL) 25 MG tablet Take 25 mg by mouth 2 (two) times daily.     No current facility-administered medications for this visit.   Allergies:  Penicillins, Penciclovir, and Alendronate   Social History: The patient  reports that she has never smoked. She has never used smokeless tobacco. She reports that she does not currently use alcohol. She reports that she does not use drugs.   Family History: The patient's family history includes Breast cancer (age of onset: 61) in her mother.   ROS:  Please see the history of present illness. Otherwise, complete review of systems is positive for none  All other systems are reviewed and negative.   Physical Exam: VS:  BP 124/78   Pulse 73   Wt 146 lb 9.6 oz (66.5 kg)   SpO2 95%   BMI 31.72 kg/m , BMI Body mass index is 31.72 kg/m.  Wt Readings from Last 3 Encounters:  06/04/24 146 lb 9.6 oz (66.5 kg)  05/31/24 146 lb 6.4 oz (66.4 kg)  05/16/24 141 lb 9.6 oz (64.2 kg)    General: Patient appears comfortable at rest. HEENT: Conjunctiva and lids normal, oropharynx clear with moist mucosa. Neck: Supple,  no elevated JVP or carotid bruits, no thyromegaly. Lungs: Clear to auscultation, nonlabored breathing at rest. Cardiac: ESM with absent S2 Abdomen: Soft, nontender, no hepatomegaly, bowel sounds present, no guarding or rebound. Extremities: 2-3+ pitting edema, distal pulses 2+. Skin: Warm and dry. Musculoskeletal: No kyphosis. Neuropsychiatric: Alert and oriented x3, affect grossly appropriate.  Recent Labwork: 10/12/2023: Magnesium  2.0 04/09/2024: ALT 11; AST 18; BUN 19; Creatinine, Ser 0.64; Hemoglobin 12.0; Platelets 304; Potassium 4.2; Sodium 133 04/13/2024: B Natriuretic Peptide 89.0; TSH 3.293  No results found for: "CHOL", "TRIG", "HDL", "CHOLHDL", "VLDL", "LDLCALC", "LDLDIRECT"    Assessment and Plan:  Valvular heart  disease (moderate to severe aortic valve stenosis with DVI 0.24 and mild aortic valve regurgitation) in April 2025: Asymptomatic, wheelchair-bound due to severe right hip arthritis.  Auscultation findings consistent with severe aortic valve stenosis.  Discussed the pathophysiology, symptoms and management of severe aortic valve stenosis.  Will repeat echocardiogram in 6 months or sooner if she is symptomatic and refer to structural heart clinic.  Will monitor for now.  Bilateral lower extremity swelling: Use compression socks and continue p.o. Lasix  20 mg twice daily.  Does not have SOB but not active either.       Medication Adjustments/Labs and Tests Ordered: Current medicines are reviewed at length with the patient today.  Concerns regarding medicines are outlined above.    Disposition:  Follow up 6 months  Signed Marvyn Torrez Priya Oleva Koo, MD, 06/04/2024 1:13 PM    [facility] [address] [place], [place], [place] [address]

## 2024-06-04 NOTE — Patient Instructions (Addendum)
 Medication Instructions:  Your physician recommends that you continue on your current medications as directed. Please refer to the Current Medication list given to you today.   Labwork: None  Testing/Procedures: Your physician has requested that you have an echocardiogram in 6 months. Echocardiography is a painless test that uses sound waves to create images of your heart. It provides your doctor with information about the size and shape of your heart and how well your heart's chambers and valves are working. This procedure takes approximately one hour. There are no restrictions for this procedure. Please do NOT wear cologne, perfume, aftershave, or lotions (deodorant is allowed). Please arrive 15 minutes prior to your appointment time.  Please note: We ask at that you not bring children with you during ultrasound (echo/ vascular) testing. Due to room size and safety concerns, children are not allowed in the ultrasound rooms during exams. Our front office staff cannot provide observation of children in our lobby area while testing is being conducted. An adult accompanying a patient to their appointment will only be allowed in the ultrasound room at the discretion of the ultrasound technician under special circumstances. We apologize for any inconvenience.   Follow-Up: Your physician recommends that you schedule a follow-up appointment in: 6 months  Any Other Special Instructions Will Be Listed Below (If Applicable). Thank you for choosing [facility]!     If you need a refill on your cardiac medications before your next appointment, please call your pharmacy.

## 2024-06-07 ENCOUNTER — Other Ambulatory Visit (HOSPITAL_COMMUNITY)
Admission: RE | Admit: 2024-06-07 | Discharge: 2024-06-07 | Disposition: A | Discharge status: Home / Self Care | Source: Skilled Nursing Facility | Attending: Adult Health | Admitting: Adult Health

## 2024-06-07 DIAGNOSIS — E876 Hypokalemia: Secondary | ICD-10-CM | POA: Insufficient documentation

## 2024-06-07 LAB — BASIC METABOLIC PANEL WITH GFR
Anion gap: 9 (ref 5–15)
BUN: 32 mg/dL — ABNORMAL HIGH (ref 8–23)
CO2: 24 mmol/L (ref 22–32)
Calcium: 8.8 mg/dL — ABNORMAL LOW (ref 8.9–10.3)
Chloride: 104 mmol/L (ref 98–111)
Creatinine, Ser: 0.74 mg/dL (ref 0.44–1.00)
GFR, Estimated: >60 mL/min (ref >60)
Glucose, Bld: 102 mg/dL — ABNORMAL HIGH (ref 70–99)
Potassium: 4.1 mmol/L (ref 3.5–5.1)
Sodium: 137 mmol/L (ref 135–145)

## 2024-06-18 ENCOUNTER — Non-Acute Institutional Stay (SKILLED_NURSING_FACILITY): Payer: Self-pay | Admitting: Adult Health

## 2024-06-18 ENCOUNTER — Encounter: Payer: Self-pay | Admitting: Adult Health

## 2024-06-18 DIAGNOSIS — M7989 Other specified soft tissue disorders: Secondary | ICD-10-CM

## 2024-06-18 DIAGNOSIS — J3089 Other allergic rhinitis: Secondary | ICD-10-CM

## 2024-06-18 DIAGNOSIS — F39 Unspecified mood [affective] disorder: Secondary | ICD-10-CM | POA: Diagnosis not present

## 2024-06-18 NOTE — Progress Notes (Unsigned)
 Location:  [facility] Room Number: [id number] Place of Service:  SNF (31)   CODE STATUS: dnr   Allergies  Allergen Reactions   Penicillins Hives, Rash and Other (See Comments)    Childhood allergy   Penciclovir Rash    Hives   Alendronate Nausea And Vomiting and Nausea Only    GI intolerance    Chief Complaint  Patient presents with   Medical Management of Chronic Issues                 Chronic non-seasonal allergic rhinitis: Bilateral lower extremity edema: Delusions and hallucinations:     HPI:  She is a 73 y.o. long term resident of this facility being seen for the management of her chronic illnesses:Chronic non-seasonal allergic rhinitis: Bilateral lower extremity edema: Delusions and hallucinations. There are no reports of uncontrolled pain. She has been seen by psychiatry. She has had fewer emotional outbursts. She denies any anxiety.    Past Medical History:  Diagnosis Date   Difficulty in walking    per [facility]   Falls    MAR   Glucose intolerance    Goiter, non-toxic    per [facility]   Hyperlipidemia    Hypokalemia 10/10/2023   Mood disorder (HCC)    per [facility] date 10/28/23   Rhabdomyolysis 10/10/2023   Sepsis secondary to UTI (HCC) 10/10/2023   Venous stasis     Past Surgical History:  Procedure Laterality Date   AUGMENTATION MAMMAPLASTY Bilateral 2013-14   Mentor, In front of muscle. Bilateral lift    Social History   Socioeconomic History   Marital status: Married    Spouse name: Not on file   Number of children: Not on file   Years of education: Not on file   Highest education level: Not on file  Occupational History   Not on file  Tobacco Use   Smoking status: Never   Smokeless tobacco: Never  Substance and Sexual Activity   Alcohol use: Not Currently   Drug use: Never   Sexual activity: Not Currently  Other Topics Concern   Not on file  Social History Narrative   Not on file   Social Drivers of  Health   Financial Resource Strain: Low Risk  (06/21/2023)   Received from [facility]   Overall Financial Resource Strain (CARDIA)    Difficulty of Paying Living Expenses: Not very hard  Food Insecurity: No Food Insecurity (10/10/2023)   Hunger Vital Sign    Worried About Running Out of Food in the Last Year: Never true    Ran Out of Food in the Last Year: Never true  Transportation Needs: No Transportation Needs (10/10/2023)   PRAPARE - Administrator, Civil Service (Medical): No    Lack of Transportation (Non-Medical): No  Physical Activity: Not on file  Stress: Not on file  Social Connections: Unknown (10/04/2023)   Received from [facility]   Social Network    Social Network: Not on file  Intimate Partner Violence: Not At Risk (10/10/2023)   Humiliation, Afraid, Rape, and Kick questionnaire    Fear of Current or Ex-Partner: No    Emotionally Abused: No    Physically Abused: No    Sexually Abused: No   Family History  Problem Relation Age of Onset   Breast cancer Mother 50      VITAL SIGNS BP 138/76   Pulse 88   Temp (!) 97.1 F (36.2 C)  Resp 17   Ht 4' 9 (1.448 m)   Wt 146 lb 6.4 oz (66.4 kg)   SpO2 97%   BMI 31.68 kg/m   Outpatient Encounter Medications as of 06/18/2024  Medication Sig   divalproex  (DEPAKOTE ) 250 MG DR tablet Take 250 mg by mouth 2 (two) times daily.   acetaminophen  (TYLENOL ) 325 MG tablet Take 650 mg by mouth every 6 (six) hours as needed.   furosemide  (LASIX ) 20 MG tablet Take 20 mg by mouth 2 (two) times daily. 9 AM and 2 PM   melatonin 5 MG TABS Take 5 mg by mouth at bedtime as needed.   meloxicam (MOBIC) 15 MG tablet Take 15 mg by mouth daily.   OLANZapine (ZYPREXA) 7.5 MG tablet Take 7.5 mg by mouth every evening.   polyethylene glycol (MIRALAX  / GLYCOLAX ) 17 g packet Take 17 g by mouth daily as needed for mild constipation.   No facility-administered encounter medications on file as of 06/18/2024.      SIGNIFICANT DIAGNOSTIC EXAMS  PREVIOUS   10-10-23: wbc 13.8; hgb 17.9; hct 51.5; mcv 87.6 plt 396; glucose 131; bun 43; creat 1.08; k+ 2.4; na++ 135; ca 9.0; gfr 55; protein 7.3 albumin 3.7 urine culture: e-coli: cipro 10-11-23: tsh 3.452 free t4: 1.47 10-14-23: wbc 7.3; hgb 13.0; hct 38.8; mcv 91.3 plt 285; glucose 115; bun 23; creat 0.73; k+ 3.8; na++ 134; ca 8.7; gfr >60 11-03-23: wbc 5.3; hgb 11.6; hct 34.9; mcv 93.1 plt 348; glucose 109; bun 16; creat 0.60; k+ 3.6; na++ 137; ca 9.3 gfr >60; protein 6.6 albumin 3.6   TODAY  11-21-23: wbc 5.5; hgb 11.8; hct 35.4; mcv 92.9 plt 390; glucose 96; bun 13; creat 0.50; k+ 3.4; na++ 134; ca 8.6; gfr >60; protein 6.3 albumin 3.2 depakote  51 01-05-24: glucose 104; bun 13; creat 0.46; k+ 3.3; na++ 132; ca 8.9 gfr >60 01-10-24: glucose 108; bun 17; creat 0.51; k+ 3.6; na++ 133; ca 8.8; gfr >60 01-26-24: wbc 10.7; hgb 11.6; hct 34.4. mcv 91.5 plt 289 02-16-24: glucose 102; bun 20; creat 0.59; k+ 5.0; na++ 132; ca 8.9; gfr >60 04-09-24: wbc 6.1; hgb 12.0; hct 35.7; mcv 89.5 plt 304; glucose 104; bun 19; creat 0.64; k+ 4.2; na++ 133; ca 9.3; gfr >60; protein 7.4 albumin 3.5 04-13-24: tsh 3.293; BNP 89 06-07-24: glucose 102; bun 32; creat 0.74; k+ 4.1; na++ 137; ca 8.8; gfr >60    Review of Systems  Constitutional:  Negative for malaise/fatigue.  Respiratory:  Negative for cough and shortness of breath.   Cardiovascular:  Positive for leg swelling. Negative for chest pain and palpitations.  Gastrointestinal:  Negative for abdominal pain, constipation and heartburn.  Musculoskeletal:  Negative for back pain, joint pain and myalgias.  Skin: Negative.   Neurological:  Negative for dizziness.  Psychiatric/Behavioral:  The patient is not nervous/anxious.    Physical Exam Constitutional:      General: She is not in acute distress.    Appearance: She is well-developed. She is not diaphoretic.  Neck:     Thyroid : Thyromegaly present.    Cardiovascular:     Rate and Rhythm: Normal rate and regular rhythm.     Heart sounds: Murmur heard.  Pulmonary:     Effort: Pulmonary effort is normal. No respiratory distress.     Breath sounds: Normal breath sounds.  Abdominal:     General: Bowel sounds are normal. There is no distension.     Palpations: Abdomen is soft.  Tenderness: There is no abdominal tenderness.   Musculoskeletal:        General: Normal range of motion.     Cervical back: Neck supple.     Right lower leg: Edema present.     Left lower leg: Edema present.  Lymphadenopathy:     Cervical: No cervical adenopathy.   Skin:    General: Skin is warm and dry.   Neurological:     Mental Status: She is alert. Mental status is at baseline.     Comments: BCAT 10-26-23: 24/50    Psychiatric:        Mood and Affect: Mood normal.    ASSESSMENT/ PLAN:  TODAY  Chronic non-seasonal allergic rhinitis: will continue to monitor   2. Bilateral lower extremity edema: will continue lasix  20 mg twice daily   3. Delusions and hallucinations: she is presently taking zyprexa 7.5 mg nightly and depakote  250 mg twice daily.   PREVIOUS   4. Glucose intolerance: hgb A1c 5.5  5. Goiter: mildly elevated free t4: 1.47 will monitor   6. Aortic stenosis: awaiting cardiology consult.   7. Vascular dementia with behavioral disturbance and mood disorder: weight is 146 pounds; will continue depakote  250 mg  daily to help stabilize mood  8. Mixed hyperlipidemia: ldl 127 is off statin  9. Right knee osteoarthritis: will continue mobic 15 mg daily for pain management         Barnie Seip NP [facility]   call [phone]

## 2024-06-20 DIAGNOSIS — J3089 Other allergic rhinitis: Secondary | ICD-10-CM | POA: Insufficient documentation

## 2024-06-27 ENCOUNTER — Encounter: Payer: Self-pay | Admitting: Adult Health

## 2024-06-27 ENCOUNTER — Non-Acute Institutional Stay (SKILLED_NURSING_FACILITY): Payer: Self-pay | Admitting: Adult Health

## 2024-06-27 DIAGNOSIS — L03115 Cellulitis of right lower limb: Secondary | ICD-10-CM

## 2024-06-27 DIAGNOSIS — R600 Localized edema: Secondary | ICD-10-CM | POA: Diagnosis not present

## 2024-06-27 NOTE — Progress Notes (Signed)
 Location:  [facility] Room Number: 157 W Place of Service:  SNF (31)   CODE STATUS: DNR  Allergies  Allergen Reactions   Penicillins Hives, Rash and Other (See Comments)    Childhood allergy   Penciclovir Rash    Hives   Alendronate Nausea And Vomiting and Nausea Only    GI intolerance    Chief Complaint  Patient presents with   Edema    HPI:  She has worsening edema. Right lower extremity has redness present worse than left lower extremity. Does have n open area present. She is taking lasix  20 mg twice daily. She does have bilateral lower extremity pain. There are no reports of fever present.   Past Medical History:  Diagnosis Date   Difficulty in walking    per [facility]   Falls    MAR   Glucose intolerance    Goiter, non-toxic    per [facility]   Hyperlipidemia    Hypokalemia 10/10/2023   Mood disorder (HCC)    per [facility] date 10/28/23   Rhabdomyolysis 10/10/2023   Sepsis secondary to UTI (HCC) 10/10/2023   Venous stasis     Past Surgical History:  Procedure Laterality Date   AUGMENTATION MAMMAPLASTY Bilateral 2013-14   Mentor, In front of muscle. Bilateral lift    Social History   Socioeconomic History   Marital status: Married    Spouse name: Not on file   Number of children: Not on file   Years of education: Not on file   Highest education level: Not on file  Occupational History   Not on file  Tobacco Use   Smoking status: Never   Smokeless tobacco: Never  Substance and Sexual Activity   Alcohol use: Not Currently   Drug use: Never   Sexual activity: Not Currently  Other Topics Concern   Not on file  Social History Narrative   Not on file   Social Drivers of Health   Financial Resource Strain: Low Risk  (06/21/2023)   Received from [facility]   Overall Financial Resource Strain (CARDIA)    Difficulty of Paying Living Expenses: Not very hard  Food Insecurity: No Food Insecurity (10/10/2023)    Hunger Vital Sign    Worried About Running Out of Food in the Last Year: Never true    Ran Out of Food in the Last Year: Never true  Transportation Needs: No Transportation Needs (10/10/2023)   PRAPARE - Administrator, Civil Service (Medical): No    Lack of Transportation (Non-Medical): No  Physical Activity: Not on file  Stress: Not on file  Social Connections: Unknown (10/04/2023)   Received from [facility]   Social Network    Social Network: Not on file  Intimate Partner Violence: Not At Risk (10/10/2023)   Humiliation, Afraid, Rape, and Kick questionnaire    Fear of Current or Ex-Partner: No    Emotionally Abused: No    Physically Abused: No    Sexually Abused: No   Family History  Problem Relation Age of Onset   Breast cancer Mother 50      VITAL SIGNS BP 134/71   Ht 4' 9 (1.448 m)   Wt 151 lb 6.4 oz (68.7 kg)   BMI 32.76 kg/m   Outpatient Encounter Medications as of 06/27/2024  Medication Sig   acetaminophen  (TYLENOL ) 325 MG tablet Take 650 mg by mouth every 6 (six) hours as needed.   ammonium lactate (LAC-HYDRIN)  12 % lotion Apply 1 Application topically daily.   divalproex  (DEPAKOTE ) 250 MG DR tablet Take 250 mg by mouth 2 (two) times daily.   doxycycline (ADOXA) 100 MG tablet Take 100 mg by mouth 2 (two) times daily.   furosemide  (LASIX ) 20 MG tablet Take 20 mg by mouth 2 (two) times daily. 9 AM and 2 PM (Patient taking differently: Take 40 mg by mouth 2 (two) times daily. 9 AM and 2 PM)   meloxicam (MOBIC) 15 MG tablet Take 15 mg by mouth daily.   OLANZapine (ZYPREXA) 7.5 MG tablet Take 7.5 mg by mouth every evening.   polyethylene glycol (MIRALAX  / GLYCOLAX ) 17 g packet Take 17 g by mouth daily as needed for mild constipation.   melatonin 5 MG TABS Take 5 mg by mouth at bedtime as needed. (Patient not taking: Reported on 06/27/2024)   No facility-administered encounter medications on file as of 06/27/2024.     SIGNIFICANT DIAGNOSTIC  EXAMS  PREVIOUS   10-10-23: wbc 13.8; hgb 17.9; hct 51.5; mcv 87.6 plt 396; glucose 131; bun 43; creat 1.08; k+ 2.4; na++ 135; ca 9.0; gfr 55; protein 7.3 albumin 3.7 urine culture: e-coli: cipro 10-11-23: tsh 3.452 free t4: 1.47 10-14-23: wbc 7.3; hgb 13.0; hct 38.8; mcv 91.3 plt 285; glucose 115; bun 23; creat 0.73; k+ 3.8; na++ 134; ca 8.7; gfr >60 11-03-23: wbc 5.3; hgb 11.6; hct 34.9; mcv 93.1 plt 348; glucose 109; bun 16; creat 0.60; k+ 3.6; na++ 137; ca 9.3 gfr >60; protein 6.6 albumin 3.6  11-21-23: wbc 5.5; hgb 11.8; hct 35.4; mcv 92.9 plt 390; glucose 96; bun 13; creat 0.50; k+ 3.4; na++ 134; ca 8.6; gfr >60; protein 6.3 albumin 3.2 depakote  51 01-05-24: glucose 104; bun 13; creat 0.46; k+ 3.3; na++ 132; ca 8.9 gfr >60 01-10-24: glucose 108; bun 17; creat 0.51; k+ 3.6; na++ 133; ca 8.8; gfr >60 01-26-24: wbc 10.7; hgb 11.6; hct 34.4. mcv 91.5 plt 289 02-16-24: glucose 102; bun 20; creat 0.59; k+ 5.0; na++ 132; ca 8.9; gfr >60 04-09-24: wbc 6.1; hgb 12.0; hct 35.7; mcv 89.5 plt 304; glucose 104; bun 19; creat 0.64; k+ 4.2; na++ 133; ca 9.3; gfr >60; protein 7.4 albumin 3.5 04-13-24: tsh 3.293; BNP 89 06-07-24: glucose 102; bun 32; creat 0.74; k+ 4.1; na++ 137; ca 8.8; gfr >60   NO NEW LABS.   Review of Systems  Constitutional:  Negative for malaise/fatigue.  Respiratory:  Negative for cough and shortness of breath.   Cardiovascular:  Positive for leg swelling. Negative for chest pain and palpitations.  Gastrointestinal:  Negative for abdominal pain, constipation and heartburn.  Musculoskeletal:  Positive for myalgias. Negative for back pain and joint pain.       Leg pain   Skin: Negative.   Neurological:  Negative for dizziness.  Psychiatric/Behavioral:  The patient is not nervous/anxious.    Physical Exam Constitutional:      General: She is not in acute distress.    Appearance: She is well-developed. She is not diaphoretic.  Neck:     Thyroid : No thyromegaly.  Cardiovascular:      Rate and Rhythm: Normal rate and regular rhythm.     Pulses: Normal pulses.     Heart sounds: Normal heart sounds.  Pulmonary:     Effort: Pulmonary effort is normal. No respiratory distress.     Breath sounds: Normal breath sounds.  Abdominal:     General: Bowel sounds are normal. There is no distension.  Palpations: Abdomen is soft.     Tenderness: There is no abdominal tenderness.  Musculoskeletal:        General: Normal range of motion.     Cervical back: Neck supple.     Right lower leg: Edema present.     Left lower leg: Edema present.     Comments: Bilateral lower extremity edema 3+ right >left Right lower extremity with open area present with redness present   Lymphadenopathy:     Cervical: No cervical adenopathy.  Skin:    General: Skin is warm and dry.  Neurological:     Mental Status: She is alert. Mental status is at baseline.  Psychiatric:        Mood and Affect: Mood normal.      ASSESSMENT/ PLAN:  TODAY  Bilateral lower extremity edema Right lower extremity cellulitis  Will begin doxycycline 100 mg twice daily through 07-04-24.  Will increase lasix  to 40 mg twice daily  Will repeat bmp on 07-05-24 Will begin tylenol  650 mg three times daily for leg pain    Barnie Seip NP [facility]  call [phone]

## 2024-07-05 ENCOUNTER — Other Ambulatory Visit (HOSPITAL_COMMUNITY)
Admission: RE | Admit: 2024-07-05 | Discharge: 2024-07-05 | Disposition: A | Discharge status: Home / Self Care | Source: Skilled Nursing Facility | Attending: Adult Health | Admitting: Adult Health

## 2024-07-05 DIAGNOSIS — R600 Localized edema: Secondary | ICD-10-CM | POA: Insufficient documentation

## 2024-07-05 LAB — BASIC METABOLIC PANEL WITH GFR
Anion gap: 12 (ref 5–15)
BUN: 36 mg/dL — ABNORMAL HIGH (ref 8–23)
CO2: 26 mmol/L (ref 22–32)
Calcium: 9.2 mg/dL (ref 8.9–10.3)
Chloride: 99 mmol/L (ref 98–111)
Creatinine, Ser: 0.76 mg/dL (ref 0.44–1.00)
GFR, Estimated: >60 mL/min (ref >60)
Glucose, Bld: 103 mg/dL — ABNORMAL HIGH (ref 70–99)
Potassium: 3.6 mmol/L (ref 3.5–5.1)
Sodium: 137 mmol/L (ref 135–145)

## 2024-07-10 DIAGNOSIS — L03115 Cellulitis of right lower limb: Secondary | ICD-10-CM | POA: Insufficient documentation

## 2024-07-16 ENCOUNTER — Other Ambulatory Visit (HOSPITAL_COMMUNITY)
Admission: RE | Admit: 2024-07-16 | Discharge: 2024-07-16 | Disposition: A | Discharge status: Home / Self Care | Attending: Adult Health | Admitting: Adult Health

## 2024-07-16 DIAGNOSIS — N390 Urinary tract infection, site not specified: Secondary | ICD-10-CM | POA: Insufficient documentation

## 2024-07-16 LAB — URINALYSIS, ROUTINE W REFLEX MICROSCOPIC
Bilirubin Urine: NEGATIVE
Glucose, UA: NEGATIVE mg/dL
Hgb urine dipstick: NEGATIVE
Ketones, ur: NEGATIVE mg/dL
Leukocytes,Ua: NEGATIVE
Nitrite: NEGATIVE
Protein, ur: NEGATIVE mg/dL
Specific Gravity, Urine: 1.01 (ref 1.005–1.030)
pH: 7 (ref 5.0–8.0)

## 2024-07-17 ENCOUNTER — Other Ambulatory Visit (HOSPITAL_COMMUNITY)
Admission: RE | Admit: 2024-07-17 | Discharge: 2024-07-17 | Disposition: A | Discharge status: Home / Self Care | Source: Skilled Nursing Facility | Attending: Adult Health | Admitting: Adult Health

## 2024-07-17 ENCOUNTER — Non-Acute Institutional Stay (SKILLED_NURSING_FACILITY): Payer: Self-pay | Admitting: Adult Health

## 2024-07-17 ENCOUNTER — Encounter: Payer: Self-pay | Admitting: Adult Health

## 2024-07-17 DIAGNOSIS — I350 Nonrheumatic aortic (valve) stenosis: Secondary | ICD-10-CM | POA: Diagnosis not present

## 2024-07-17 DIAGNOSIS — R600 Localized edema: Secondary | ICD-10-CM | POA: Diagnosis present

## 2024-07-17 DIAGNOSIS — R000 Tachycardia, unspecified: Secondary | ICD-10-CM | POA: Diagnosis not present

## 2024-07-17 DIAGNOSIS — E876 Hypokalemia: Secondary | ICD-10-CM | POA: Insufficient documentation

## 2024-07-17 DIAGNOSIS — R627 Adult failure to thrive: Secondary | ICD-10-CM

## 2024-07-17 LAB — BRAIN NATRIURETIC PEPTIDE: B Natriuretic Peptide: 264 pg/mL — ABNORMAL HIGH (ref 0.0–100.0)

## 2024-07-17 LAB — CBC WITH DIFFERENTIAL/PLATELET
Abs Immature Granulocytes: 0.05 K/uL (ref 0.00–0.07)
Basophils Absolute: 0 K/uL (ref 0.0–0.1)
Basophils Relative: 0 %
Eosinophils Absolute: 0 K/uL (ref 0.0–0.5)
Eosinophils Relative: 0 %
HCT: 41.1 % (ref 36.0–46.0)
Hemoglobin: 13.6 g/dL (ref 12.0–15.0)
Immature Granulocytes: 1 %
Lymphocytes Relative: 5 %
Lymphs Abs: 0.5 K/uL — ABNORMAL LOW (ref 0.7–4.0)
MCH: 30 pg (ref 26.0–34.0)
MCHC: 33.1 g/dL (ref 30.0–36.0)
MCV: 90.7 fL (ref 80.0–100.0)
Monocytes Absolute: 0.4 K/uL (ref 0.1–1.0)
Monocytes Relative: 4 %
Neutro Abs: 8.8 K/uL — ABNORMAL HIGH (ref 1.7–7.7)
Neutrophils Relative %: 90 %
Platelets: 447 K/uL — ABNORMAL HIGH (ref 150–400)
RBC: 4.53 MIL/uL (ref 3.87–5.11)
RDW: 12.9 % (ref 11.5–15.5)
WBC: 9.7 K/uL (ref 4.0–10.5)
nRBC: 0 % (ref 0.0–0.2)

## 2024-07-17 LAB — COMPREHENSIVE METABOLIC PANEL WITH GFR
ALT: 16 U/L (ref 0–44)
AST: 27 U/L (ref 15–41)
Albumin: 3.2 g/dL — ABNORMAL LOW (ref 3.5–5.0)
Alkaline Phosphatase: 70 U/L (ref 38–126)
Anion gap: 17 — ABNORMAL HIGH (ref 5–15)
BUN: 16 mg/dL (ref 8–23)
CO2: 22 mmol/L (ref 22–32)
Calcium: 9.1 mg/dL (ref 8.9–10.3)
Chloride: 95 mmol/L — ABNORMAL LOW (ref 98–111)
Creatinine, Ser: 0.67 mg/dL (ref 0.44–1.00)
GFR, Estimated: >60 mL/min (ref >60)
Glucose, Bld: 136 mg/dL — ABNORMAL HIGH (ref 70–99)
Potassium: 3.8 mmol/L (ref 3.5–5.1)
Sodium: 134 mmol/L — ABNORMAL LOW (ref 135–145)
Total Bilirubin: 0.7 mg/dL (ref 0.0–1.2)
Total Protein: 8.7 g/dL — ABNORMAL HIGH (ref 6.5–8.1)

## 2024-07-17 NOTE — Progress Notes (Unsigned)
 Location:  [facility] Room Number: South/157/W Place of Service:  SNF (31)   CODE STATUS: DNR  Allergies  Allergen Reactions   Penicillins Hives, Rash and Other (See Comments)    Childhood allergy   Penciclovir Rash    Hives   Alendronate Nausea And Vomiting and Nausea Only    GI intolerance    Chief Complaint  Patient presents with   Transitions Of Care    change in status    HPI:    Past Medical History:  Diagnosis Date   Difficulty in walking    per [facility]   Falls    MAR   Glucose intolerance    Goiter, non-toxic    per [facility]   Hyperlipidemia    Hypokalemia 10/10/2023   Mood disorder (HCC)    per [facility] date 10/28/23   Rhabdomyolysis 10/10/2023   Sepsis secondary to UTI (HCC) 10/10/2023   Venous stasis     Past Surgical History:  Procedure Laterality Date   AUGMENTATION MAMMAPLASTY Bilateral 2013-14   Mentor, In front of muscle. Bilateral lift    Social History   Socioeconomic History   Marital status: Married    Spouse name: Not on file   Number of children: Not on file   Years of education: Not on file   Highest education level: Not on file  Occupational History   Not on file  Tobacco Use   Smoking status: Never   Smokeless tobacco: Never  Substance and Sexual Activity   Alcohol use: Not Currently   Drug use: Never   Sexual activity: Not Currently  Other Topics Concern   Not on file  Social History Narrative   Not on file   Social Drivers of Health   Financial Resource Strain: Low Risk  (06/21/2023)   Received from [facility]   Overall Financial Resource Strain (CARDIA)    Difficulty of Paying Living Expenses: Not very hard  Food Insecurity: No Food Insecurity (10/10/2023)   Hunger Vital Sign    Worried About Running Out of Food in the Last Year: Never true    Ran Out of Food in the Last Year: Never true  Transportation Needs: No Transportation Needs (10/10/2023)   PRAPARE -  Administrator, Civil Service (Medical): No    Lack of Transportation (Non-Medical): No  Physical Activity: Not on file  Stress: Not on file  Social Connections: Unknown (10/04/2023)   Received from [facility]   Social Network    Social Network: Not on file  Intimate Partner Violence: Not At Risk (10/10/2023)   Humiliation, Afraid, Rape, and Kick questionnaire    Fear of Current or Ex-Partner: No    Emotionally Abused: No    Physically Abused: No    Sexually Abused: No   Family History  Problem Relation Age of Onset   Breast cancer Mother 48      VITAL SIGNS BP (!) 167/83   Pulse (!) 118   Temp 100 F (37.8 C)   Resp 20   Ht 4' 9.4 (1.458 m)   Wt 152 lb 12.8 oz (69.3 kg)   SpO2 93%   BMI 32.61 kg/m   Outpatient Encounter Medications as of 07/17/2024  Medication Sig   acetaminophen  (TYLENOL ) 325 MG tablet Take 650 mg by mouth 3 (three) times daily.   ammonium lactate (LAC-HYDRIN) 12 % lotion Apply 1 Application topically daily.   divalproex  (DEPAKOTE ) 250 MG DR tablet Take  250 mg by mouth 2 (two) times daily.   furosemide  (LASIX ) 40 MG tablet Take 40 mg by mouth 2 (two) times daily.   meloxicam (MOBIC) 15 MG tablet Take 15 mg by mouth daily.   OLANZapine (ZYPREXA) 7.5 MG tablet Take 7.5 mg by mouth every evening.   polyethylene glycol (MIRALAX  / GLYCOLAX ) 17 g packet Take 17 g by mouth daily as needed for mild constipation.   No facility-administered encounter medications on file as of 07/17/2024.     SIGNIFICANT DIAGNOSTIC EXAMS       ASSESSMENT/ PLAN:     Barnie Seip NP [facility]  Contact [phone] Monday through Friday 8am- 5pm  After hours call [phone]

## 2024-07-19 DIAGNOSIS — R000 Tachycardia, unspecified: Secondary | ICD-10-CM | POA: Insufficient documentation

## 2024-07-19 DIAGNOSIS — R627 Adult failure to thrive: Secondary | ICD-10-CM | POA: Insufficient documentation

## 2024-07-19 LAB — URINE CULTURE: Culture: >=100000 — AB

## 2024-07-25 ENCOUNTER — Encounter: Payer: Self-pay | Admitting: Internal Medicine

## 2024-07-25 ENCOUNTER — Non-Acute Institutional Stay (SKILLED_NURSING_FACILITY): Payer: Self-pay | Admitting: Internal Medicine

## 2024-07-25 DIAGNOSIS — F22 Delusional disorders: Secondary | ICD-10-CM | POA: Diagnosis not present

## 2024-07-25 DIAGNOSIS — R600 Localized edema: Secondary | ICD-10-CM | POA: Diagnosis not present

## 2024-07-25 DIAGNOSIS — I350 Nonrheumatic aortic (valve) stenosis: Secondary | ICD-10-CM

## 2024-07-25 NOTE — Patient Instructions (Signed)
 See assessment and plan under each diagnosis in the problem list and acutely for this visit

## 2024-07-25 NOTE — Progress Notes (Unsigned)
 NURSING HOME LOCATION:  [facility] ROOM NUMBER:  [id number]  CODE STATUS:  DNR  PCP: Landy Barnie RAMAN, NP   This is a nursing facility follow up visit of chronic medical diagnoses to document compliance with Regulation 483.30 (c) in The Long Term Care Survey Manual Phase 2 which mandates caregiver visit ( visits can alternate among physician, PA or NP as per statutes) within 10 days of 30 days / 60 days/ 90 days post admission to SNF date  .  Interim medical record and care since last SNF visit was updated with review of diagnostic studies and change in clinical status since last visit were documented.  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 Cardiology evaluation for possible valve replacement could not be completed as she declined to go for the consultation.  In this context the severe peripheral edema has persisted.  She is on furosemide  40 mg twice daily.  BNP was only mild-moderately elevated on 7/22 with a value of 264. The paranoia has been manifested as fear of personal harm & repeated calls to the police; this has subsided significantly.  In fact in the last week she has remained essentially bedridden.  Clinically there was concern that her condition was deteriorating & preterminal. Labs are current as of 7/22.  She has developed mild hyponatremia with a value 134.  There is also  mild hyperglycemia with peak glucose of 136.  Current albumin is 3.2 down from a prior value of 3.5.  Prior to that time there had been improvement in the albumin from a nadir value of 2.5.  Total protein has been increasing serially after initial nadir of 5.0.  Current value was elevated at 8.7.  Normal values are 6.5-8.1.  Review of systems was completely negative with her response a  monosyllabic no to all queries.Specifically she did not validate any cardiopulmonary symptoms.  Constitutional: No fever, significant weight change, fatigue  Eyes: No redness, discharge, pain, vision change ENT/mouth: No nasal congestion,  purulent discharge, earache, change in hearing, sore throat  Cardiovascular: No chest pain, palpitations, paroxysmal nocturnal dyspnea  Respiratory: No cough, sputum production, hemoptysis, DOE, significant snoring, apnea   Gastrointestinal: No heartburn, dysphagia, abdominal pain, nausea /vomiting, rectal bleeding, melena, change in bowels Genitourinary: No dysuria, hematuria, pyuria, incontinence, nocturia Musculoskeletal: No joint stiffness, joint swelling, weakness, pain Dermatologic: No rash, pruritus, change in appearance of skin Neurologic: No dizziness, headache, syncope, seizures, numbness, tingling Psychiatric: No significant anxiety, depression, insomnia, anorexia Endocrine: No change in hair/skin/nails, excessive thirst, excessive hunger, excessive urination  Hematologic/lymphatic: No significant bruising, lymphadenopathy, abnormal bleeding Allergy/immunology: No itchy/watery eyes, significant sneezing, urticaria, angioedema  Physical exam:  Pertinent or positive findings: She basically exhibited no voluntary muscular activity with limited verbalization as noted above.  She laid in a semi left lateral decubitus position with her head tilted to the left.  Hair was disheveled.  Affect was markedly flat.  Some dental staining is present.  There is no significant neck vein distention present.  Inspirations were suboptimal but she exhibited no tachypnea.  She has a grade 2 systolic murmur.  Second heart sound is accentuated.  Her murmur radiates into the carotid arteries as well as the epigastric area.  Pedal pulses are palpable but decreased, especially posterior tibial pulses.  There is not significant  visible edema.  She has dressings over the  shins.  General appearance: Adequately nourished; no acute distress, increased work of breathing is present.   Lymphatic: No lymphadenopathy about the head, neck, axilla. Eyes: No conjunctival inflammation or lid edema is present. There is no scleral icterus. Ears:  External ear exam shows no significant lesions or deformities.   Nose:  External nasal examination shows no deformity or inflammation. Nasal mucosa are pink and moist without lesions, exudates Neck:  No thyromegaly, masses, tenderness noted.    Heart:  No gallop,  click, rub .  Lungs:  without wheezes, rhonchi, rales, rubs. Abdomen: Bowel sounds are normal. Abdomen is soft and nontender with no organomegaly, hernias, masses. GU: Deferred  Extremities:  No cyanosis, clubbing  Neurologic exam :Balance, Rhomberg, finger to nose testing could not be completed due to clinical state Skin: Warm & dry w/o tenting. No significant rash.  See summary under each active problem in the Problem List with associated updated therapeutic plan

## 2024-07-25 NOTE — Assessment & Plan Note (Signed)
 Edema is markedly improved and essentially nonvisible today.  Furosemide  will be decreased to 40 mg daily with close monitoring of weights.

## 2024-07-25 NOTE — Assessment & Plan Note (Signed)
 Murmur is unchanged but there are no overt findings of decompensated congestive heart failure.  She refused cardiology evaluation.

## 2024-07-25 NOTE — Assessment & Plan Note (Signed)
 Today her interaction while repetitive monosyllabic no.  At this time paranoid behavior appears to be quiescent.  Continue to monitor.

## 2024-07-26 ENCOUNTER — Non-Acute Institutional Stay (SKILLED_NURSING_FACILITY): Payer: Self-pay | Admitting: Adult Health

## 2024-07-26 ENCOUNTER — Encounter: Payer: Self-pay | Admitting: Adult Health

## 2024-07-26 DIAGNOSIS — I350 Nonrheumatic aortic (valve) stenosis: Secondary | ICD-10-CM

## 2024-07-26 DIAGNOSIS — E049 Nontoxic goiter, unspecified: Secondary | ICD-10-CM | POA: Diagnosis not present

## 2024-07-26 DIAGNOSIS — F01518 Vascular dementia, unspecified severity, with other behavioral disturbance: Secondary | ICD-10-CM | POA: Diagnosis not present

## 2024-07-26 NOTE — Progress Notes (Signed)
 Location:  [facility] Room Number: 157-W Place of Service:  SNF (31)   CODE STATUS: dnr   Allergies  Allergen Reactions   Penicillins Hives, Rash and Other (See Comments)    Childhood allergy   Penciclovir Rash    Hives   Alendronate Nausea And Vomiting and Nausea Only    GI intolerance    Chief Complaint  Patient presents with   Care plan meeting    Acute visit.    HPI:  We have come together for her care plan meeting. Family present. BIMS 3/15 mood: paranoia is decreased, has periods of lethargy. She is using a wheelchair; with 3 falls without injury.  She requires setup to dependent assist with her adl care. Dietary: setup for meals regular diet: appetite stable. Therapy: none at this time. Activities: does participate. She is having increased episodes of lethargy present. Has had her zyprexia increased to 7.5 mg nightly. Will need to lower her dosage back to 5 mg nightly. Her heart valve function is getting worse; she has declined a cardiology consult. She will continue to be followed for her chronic illnesses including: Severe aortic stenosis   Goiter   Vascular dementia with behavioral disturbance  Past Medical History:  Diagnosis Date   Difficulty in walking    per [facility]   Falls    MAR   Glucose intolerance    Goiter, non-toxic    per [facility]   Hyperlipidemia    Hypokalemia 10/10/2023   Mood disorder (HCC)    per [facility] date 10/28/23   Rhabdomyolysis 10/10/2023   Sepsis secondary to UTI (HCC) 10/10/2023   Venous stasis     Past Surgical History:  Procedure Laterality Date   AUGMENTATION MAMMAPLASTY Bilateral 2013-14   Mentor, In front of muscle. Bilateral lift    Social History   Socioeconomic History   Marital status: Married    Spouse name: Not on file   Number of children: Not on file   Years of education: Not on file   Highest education level: Not on file  Occupational History   Not on file  Tobacco Use    Smoking status: Never   Smokeless tobacco: Never  Substance and Sexual Activity   Alcohol use: Not Currently   Drug use: Never   Sexual activity: Not Currently  Other Topics Concern   Not on file  Social History Narrative   Not on file   Social Drivers of Health   Financial Resource Strain: Low Risk  (06/21/2023)   Received from [facility]   Overall Financial Resource Strain (CARDIA)    Difficulty of Paying Living Expenses: Not very hard  Food Insecurity: No Food Insecurity (10/10/2023)   Hunger Vital Sign    Worried About Running Out of Food in the Last Year: Never true    Ran Out of Food in the Last Year: Never true  Transportation Needs: No Transportation Needs (10/10/2023)   PRAPARE - Administrator, Civil Service (Medical): No    Lack of Transportation (Non-Medical): No  Physical Activity: Not on file  Stress: Not on file  Social Connections: Unknown (10/04/2023)   Received from [facility]   Social Network    Social Network: Not on file  Intimate Partner Violence: Not At Risk (10/10/2023)   Humiliation, Afraid, Rape, and Kick questionnaire    Fear of Current or Ex-Partner: No    Emotionally Abused: No    Physically Abused:  No    Sexually Abused: No   Family History  Problem Relation Age of Onset   Breast cancer Mother 2      VITAL SIGNS BP 111/70   Resp 20   Ht 4' 9.4 (1.458 m)   Wt 152 lb 12.8 oz (69.3 kg)   BMI 32.61 kg/m   Outpatient Encounter Medications as of 07/26/2024  Medication Sig   acetaminophen  (TYLENOL ) 325 MG tablet Take 650 mg by mouth 3 (three) times daily.   ammonium lactate (LAC-HYDRIN) 12 % lotion Apply 1 Application topically daily.   divalproex  (DEPAKOTE ) 250 MG DR tablet Take 250 mg by mouth 2 (two) times daily.   furosemide  (LASIX ) 40 MG tablet Take 40 mg by mouth 2 (two) times daily. (Patient taking differently: Take 40 mg by mouth daily.)   meloxicam (MOBIC) 15 MG tablet Take 15 mg by mouth daily.    metoprolol tartrate (LOPRESSOR) 25 MG tablet Take 25 mg by mouth 2 (two) times daily.   OLANZapine (ZYPREXA) 7.5 MG tablet Take 7.5 mg by mouth every evening.   polyethylene glycol (MIRALAX  / GLYCOLAX ) 17 g packet Take 17 g by mouth daily as needed for mild constipation.   No facility-administered encounter medications on file as of 07/26/2024.     SIGNIFICANT DIAGNOSTIC EXAMS  PREVIOUS   10-10-23: wbc 13.8; hgb 17.9; hct 51.5; mcv 87.6 plt 396; glucose 131; bun 43; creat 1.08; k+ 2.4; na++ 135; ca 9.0; gfr 55; protein 7.3 albumin 3.7 urine culture: e-coli: cipro 10-11-23: tsh 3.452 free t4: 1.47 10-14-23: wbc 7.3; hgb 13.0; hct 38.8; mcv 91.3 plt 285; glucose 115; bun 23; creat 0.73; k+ 3.8; na++ 134; ca 8.7; gfr >60 11-03-23: wbc 5.3; hgb 11.6; hct 34.9; mcv 93.1 plt 348; glucose 109; bun 16; creat 0.60; k+ 3.6; na++ 137; ca 9.3 gfr >60; protein 6.6 albumin 3.6  11-21-23: wbc 5.5; hgb 11.8; hct 35.4; mcv 92.9 plt 390; glucose 96; bun 13; creat 0.50; k+ 3.4; na++ 134; ca 8.6; gfr >60; protein 6.3 albumin 3.2 depakote  51 01-05-24: glucose 104; bun 13; creat 0.46; k+ 3.3; na++ 132; ca 8.9 gfr >60 01-10-24: glucose 108; bun 17; creat 0.51; k+ 3.6; na++ 133; ca 8.8; gfr >60 01-26-24: wbc 10.7; hgb 11.6; hct 34.4. mcv 91.5 plt 289 02-16-24: glucose 102; bun 20; creat 0.59; k+ 5.0; na++ 132; ca 8.9; gfr >60 04-09-24: wbc 6.1; hgb 12.0; hct 35.7; mcv 89.5 plt 304; glucose 104; bun 19; creat 0.64; k+ 4.2; na++ 133; ca 9.3; gfr >60; protein 7.4 albumin 3.5 04-13-24: tsh 3.293; BNP 89 06-07-24: glucose 102; bun 32; creat 0.74; k+ 4.1; na++ 137; ca 8.8; gfr >60   NO NEW LABS.   Review of Systems  Constitutional:  Negative for malaise/fatigue.  Respiratory:  Negative for cough and shortness of breath.   Cardiovascular:  Negative for chest pain, palpitations and leg swelling.  Gastrointestinal:  Negative for abdominal pain, constipation and heartburn.  Musculoskeletal:  Negative for back pain, joint pain  and myalgias.  Skin: Negative.   Neurological:  Negative for dizziness.  Psychiatric/Behavioral:  The patient is not nervous/anxious.    Physical Exam Constitutional:      General: She is not in acute distress.    Appearance: She is well-developed. She is not diaphoretic.  Neck:     Thyroid : No thyromegaly.  Cardiovascular:     Rate and Rhythm: Normal rate and regular rhythm.     Pulses: Normal pulses.     Heart sounds:  Murmur heard.  Pulmonary:     Effort: Pulmonary effort is normal. No respiratory distress.     Breath sounds: Normal breath sounds.  Abdominal:     General: Bowel sounds are normal. There is no distension.     Palpations: Abdomen is soft.     Tenderness: There is no abdominal tenderness.  Musculoskeletal:        General: Normal range of motion.     Cervical back: Neck supple.     Right lower leg: No edema.     Left lower leg: No edema.  Lymphadenopathy:     Cervical: No cervical adenopathy.  Skin:    General: Skin is warm and dry.  Neurological:     Mental Status: She is alert. Mental status is at baseline.  Psychiatric:        Mood and Affect: Mood normal.      ASSESSMENT/ PLAN:  TODAY  Severe aortic stenosis Goiter Vascular dementia with behavioral disturbance  Will lower her zyprexa to 5 mg nightly Will continue current plan of care  Will continue to monitor her status.   Time spent with patient: 40 minutes: overall health status; medications; dietary.    Barnie Seip NP [facility]  call [phone]

## 2024-08-20 ENCOUNTER — Non-Acute Institutional Stay (SKILLED_NURSING_FACILITY): Payer: Self-pay | Admitting: Adult Health

## 2024-08-20 ENCOUNTER — Encounter: Payer: Self-pay | Admitting: Adult Health

## 2024-08-20 ENCOUNTER — Other Ambulatory Visit: Payer: Self-pay | Admitting: Adult Health

## 2024-08-20 DIAGNOSIS — R627 Adult failure to thrive: Secondary | ICD-10-CM

## 2024-08-20 DIAGNOSIS — F01518 Vascular dementia, unspecified severity, with other behavioral disturbance: Secondary | ICD-10-CM

## 2024-08-20 DIAGNOSIS — I350 Nonrheumatic aortic (valve) stenosis: Secondary | ICD-10-CM

## 2024-08-20 MED ORDER — MORPHINE SULFATE (CONCENTRATE) 20 MG/ML PO SOLN: 5.0000 mg | ORAL | 0 refills | Status: DC | PRN

## 2024-08-20 NOTE — Progress Notes (Unsigned)
 Location:  [facility] Room Number: [id number] Place of Service:  SNF (31)   CODE STATUS: dnr  Allergies  Allergen Reactions   Penicillins Hives, Rash and Other (See Comments)    Childhood allergy   Penciclovir Rash    Hives   Alendronate Nausea And Vomiting and Nausea Only    GI intolerance    Chief Complaint  Patient presents with   Acute Visit    Change in status     HPI:  She continues to decline in her status. She remains in bed; she is unable to tolerate get out of bed today. She is unable to take medications at this time. Her family is aware of her continued decline in status. She is unable to take medications at this time. There are no indications of distress present. Today she is awake but is nonverbal.   Past Medical History:  Diagnosis Date   Difficulty in walking    per [facility]   Falls    MAR   Glucose intolerance    Goiter, non-toxic    per [facility]   Hyperlipidemia    Hypokalemia 10/10/2023   Mood disorder (HCC)    per [facility] date 10/28/23   Rhabdomyolysis 10/10/2023   Sepsis secondary to UTI (HCC) 10/10/2023   Venous stasis     Past Surgical History:  Procedure Laterality Date   AUGMENTATION MAMMAPLASTY Bilateral 2013-14   M[place]or, In front of muscle. Bilateral lift    Social History   Socioeconomic History   Marital status: Married    Spouse name: Not on file   Number of children: Not on file   Years of education: Not on file   Highest education level: Not on file  Occupational History   Not on file  Tobacco Use   Smoking status: Never   Smokeless tobacco: Never  Substance and Sexual Activity   Alcohol use: Not Currently   Drug use: Never   Sexual activity: Not Currently  Other Topics Concern   Not on file  Social History Narrative   Not on file   Social Drivers of Health   Financial Resource Strain: Low Risk  (06/21/2023)   Received from [facility]   Overall Financial Resource Strain  (CARDIA)    Difficulty of Paying Living Expenses: Not very hard  Food Insecurity: No Food Insecurity (10/10/2023)   Hunger Vital Sign    Worried About Running Out of Food in the Last Year: Never true    Ran Out of Food in the Last Year: Never true  Transportation Needs: No Transportation Needs (10/10/2023)   PRAPARE - Administrator, Civil Service (Medical): No    Lack of Transportation (Non-Medical): No  Physical Activity: Not on file  Stress: Not on file  Social Connections: Unknown (10/04/2023)   Received from [facility]   Social Network    Social Network: Not on file  Intimate Partner Violence: Not At Risk (10/10/2023)   Humiliation, Afraid, Rape, and Kick questionnaire    Fear of Current or Ex-Partner: No    Emotionally Abused: No    Physically Abused: No    Sexually Abused: No   Family History  Problem Relation Age of Onset   Breast cancer Mother 90      VITAL SIGNS BP 139/71   Pulse 84   Temp 98.7 F (37.1 C)   Resp 20   Ht 4' 9.4 (1.458 m)   Wt 152 lb 12.8  oz (69.3 kg)   SpO2 92%   BMI 32.61 kg/m   Outpatient Encounter Medications as of 08/20/2024  Medication Sig   morphine  (ROXANOL) 20 MG/ML concentrated solution Take 0.25 mLs (5 mg total) by mouth every 4 (four) hours as needed for severe pain (pain score 7-10).   ammonium lactate (LAC-HYDRIN) 12 % lotion Apply 1 Application topically daily.   No facility-administered encounter medications on file as of 08/20/2024.     SIGNIFICANT DIAGNOSTIC EXAMS  PREVIOUS   10-10-23: wbc 13.8; hgb 17.9; hct 51.5; mcv 87.6 plt 396; glucose 131; bun 43; creat 1.08; k+ 2.4; na++ 135; ca 9.0; gfr 55; protein 7.3 albumin 3.7 urine culture: e-coli: cipro 10-11-23: tsh 3.452 free t4: 1.47 10-14-23: wbc 7.3; hgb 13.0; hct 38.8; mcv 91.3 plt 285; glucose 115; bun 23; creat 0.73; k+ 3.8; na++ 134; ca 8.7; gfr >60 11-03-23: wbc 5.3; hgb 11.6; hct 34.9; mcv 93.1 plt 348; glucose 109; bun 16; creat 0.60; k+ 3.6;  na++ 137; ca 9.3 gfr >60; protein 6.6 albumin 3.6  11-21-23: wbc 5.5; hgb 11.8; hct 35.4; mcv 92.9 plt 390; glucose 96; bun 13; creat 0.50; k+ 3.4; na++ 134; ca 8.6; gfr >60; protein 6.3 albumin 3.2 depakote  51 01-05-24: glucose 104; bun 13; creat 0.46; k+ 3.3; na++ 132; ca 8.9 gfr >60 01-10-24: glucose 108; bun 17; creat 0.51; k+ 3.6; na++ 133; ca 8.8; gfr >60 01-26-24: wbc 10.7; hgb 11.6; hct 34.4. mcv 91.5 plt 289 02-16-24: glucose 102; bun 20; creat 0.59; k+ 5.0; na++ 132; ca 8.9; gfr >60 04-09-24: wbc 6.1; hgb 12.0; hct 35.7; mcv 89.5 plt 304; glucose 104; bun 19; creat 0.64; k+ 4.2; na++ 133; ca 9.3; gfr >60; protein 7.4 albumin 3.5 04-13-24: tsh 3.293; BNP 89 06-07-24: glucose 102; bun 32; creat 0.74; k+ 4.1; na++ 137; ca 8.8; gfr >60   NO NEW LABS.    Review of Systems  Reason unable to perform ROS: unable to participate.   Physical Exam Constitutional:      General: She is not in acute distress.    Appearance: She is well-developed. She is not diaphoretic.  Neck:     Thyroid : No thyromegaly.  Cardiovascular:     Rate and Rhythm: Normal rate and regular rhythm.     Heart sounds: Murmur heard.  Pulmonary:     Effort: Pulmonary effort is normal. No respiratory distress.     Breath sounds: Normal breath sounds.  Abdominal:     General: Bowel sounds are normal. There is no distension.     Palpations: Abdomen is soft.     Tenderness: There is no abdominal tenderness.  Musculoskeletal:        General: Normal range of motion.     Cervical back: Neck supple.     Right lower leg: No edema.     Left lower leg: No edema.  Lymphadenopathy:     Cervical: No cervical adenopathy.  Skin:    General: Skin is warm and dry.  Neurological:     Mental Status: She is alert.     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   Will stop the following medications: tylenol ; depakote ; lasix ; mobic;  lopressor; miralax ; zyprexa.  Will begin roxanol 5 mg every 4 hours as needed    Barnie Seip NP [facility]  call [phone]

## 2024-12-04 ENCOUNTER — Encounter: Payer: Self-pay | Admitting: Internal Medicine

## 2024-12-04 ENCOUNTER — Other Ambulatory Visit: Attending: Internal Medicine
